# Patient Record
Sex: Female | Born: 1955 | Race: White | Hispanic: No | Smoking: Former smoker
Health system: Southern US, Community
[De-identification: ages and names within clinical notes are randomized; demographics above are authoritative.]

## PROBLEM LIST (undated history)

## (undated) DIAGNOSIS — R609 Edema, unspecified: Secondary | ICD-10-CM

## (undated) DIAGNOSIS — N329 Bladder disorder, unspecified: Secondary | ICD-10-CM

## (undated) DIAGNOSIS — F329 Major depressive disorder, single episode, unspecified: Secondary | ICD-10-CM

## (undated) DIAGNOSIS — M791 Myalgia, unspecified site: Secondary | ICD-10-CM

## (undated) DIAGNOSIS — F32A Depression, unspecified: Secondary | ICD-10-CM

## (undated) DIAGNOSIS — I10 Essential (primary) hypertension: Secondary | ICD-10-CM

## (undated) HISTORY — DX: Major depressive disorder, single episode, unspecified: F32.9

## (undated) HISTORY — DX: Essential (primary) hypertension: I10

## (undated) HISTORY — DX: Bladder disorder, unspecified: N32.9

## (undated) HISTORY — DX: Depression, unspecified: F32.A

## (undated) HISTORY — PX: HAND SURGERY: SHX662

## (undated) HISTORY — DX: Edema, unspecified: R60.9

## (undated) HISTORY — PX: OTHER SURGICAL HISTORY: SHX169

## (undated) HISTORY — PX: FOOT SURGERY: SHX648

## (undated) HISTORY — DX: Myalgia, unspecified site: M79.10

## (undated) HISTORY — PX: ABDOMINAL HYSTERECTOMY: SHX81

---

## 2014-04-28 ENCOUNTER — Ambulatory Visit (INDEPENDENT_AMBULATORY_CARE_PROVIDER_SITE_OTHER): Payer: BC Managed Care – PPO

## 2014-04-28 VITALS — BP 129/78 | HR 55 | Resp 18

## 2014-04-28 DIAGNOSIS — M7732 Calcaneal spur, left foot: Secondary | ICD-10-CM

## 2014-04-28 DIAGNOSIS — M722 Plantar fascial fibromatosis: Secondary | ICD-10-CM

## 2014-04-28 DIAGNOSIS — M79672 Pain in left foot: Secondary | ICD-10-CM

## 2014-04-28 MED ORDER — MELOXICAM 15 MG PO TABS: 15.0000 mg | ORAL_TABLET | Freq: Every day | ORAL | Status: DC

## 2014-04-28 NOTE — Patient Instructions (Signed)
ICE INSTRUCTIONS  Apply ice or cold pack to the affected area at least 3 times a day for 10-15 minutes each time.  You should also use ice after prolonged activity or vigorous exercise.  Do not apply ice longer than 20 minutes at one time.  Always keep a cloth between your skin and the ice pack to prevent [place].  Being consistent and following these instructions will help control your symptoms.  We suggest you purchase a gel ice pack because they are reusable and do bit leak.  Some of them are designed to wrap around the area.  Use the method that works best for you.  Here are some other suggestions for icing.   Use a frozen bag of peas or corn-inexpensive and molds well to your body, usually stays frozen for 10 to 20 minutes.  Wet a towel with cold water and squeeze out the excess until it's damp.  Place in a bag in the freezer for 20 minutes. Then remove and use.   Plantar Fasciitis Plantar fasciitis is a common condition that causes foot pain. It is soreness (inflammation) of the band of tough fibrous tissue on the bottom of the foot that runs from the heel bone (calcaneus) to the ball of the foot. The cause of this soreness may be from excessive standing, poor fitting shoes, running on hard surfaces, being overweight, having an abnormal walk, or overuse (this is common in runners) of the painful foot or feet. It is also common in aerobic exercise dancers and ballet dancers. SYMPTOMS  Most people with plantar fasciitis complain of:  Severe pain in the morning on the bottom of their foot especially when taking the first steps out of bed. This pain recedes after a few minutes of walking.  Severe pain is experienced also during walking following a long period of inactivity.  Pain is worse when walking barefoot or up stairs DIAGNOSIS  3. Your caregiver will diagnose this condition by examining and feeling your foot. 4. Special tests such as X-rays of your foot, are usually not  needed. PREVENTION   Consult a sports medicine professional before beginning a new exercise program.  Walking programs offer a good workout. With walking there is a lower chance of overuse injuries common to runners. There is less impact and less jarring of the joints.  Begin all new exercise programs slowly. If problems or pain develop, decrease the amount of time or distance until you are at a comfortable level.  Wear good shoes and replace them regularly.  Stretch your foot and the heel cords at the back of the ankle (Achilles tendon) both before and after exercise.  Run or exercise on even surfaces that are not hard. For example, asphalt is better than pavement.  Do not run barefoot on hard surfaces.  If using a treadmill, vary the incline.  Do not continue to workout if you have foot or joint problems. Seek professional help if they do not improve. HOME CARE INSTRUCTIONS   Avoid activities that cause you pain until you recover.  Use ice or cold packs on the problem or painful areas after working out.  Only take over-the-counter or prescription medicines for pain, discomfort, or fever as directed by your caregiver.  Soft shoe inserts or athletic shoes with air or gel sole cushions may be helpful.  If problems continue or become more severe, consult a sports medicine caregiver or your own health care provider. Cortisone is a potent anti-inflammatory medication that may be   injected into the painful area. You can discuss this treatment with your caregiver. MAKE SURE YOU:   Understand these instructions.  Will watch your condition.  Will get help right away if you are not doing well or get worse. Document Released: 02/22/2001 Document Revised: 08/22/2011 Document Reviewed: 04/23/2008 Exit[employer] Patient Information 2015 [employer]. This information is not intended to replace advice given to you by your health care provider. Make sure you discuss any questions you have with  your health care provider.  
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
## 2014-05-12 ENCOUNTER — Ambulatory Visit (INDEPENDENT_AMBULATORY_CARE_PROVIDER_SITE_OTHER): Payer: BC Managed Care – PPO

## 2014-05-12 VITALS — BP 128/73 | HR 55 | Resp 18

## 2014-05-12 DIAGNOSIS — M722 Plantar fascial fibromatosis: Secondary | ICD-10-CM

## 2014-05-12 DIAGNOSIS — M79672 Pain in left foot: Secondary | ICD-10-CM

## 2014-05-12 DIAGNOSIS — M7732 Calcaneal spur, left foot: Secondary | ICD-10-CM

## 2014-05-12 NOTE — Progress Notes (Signed)
   Subjective:    Patient ID: Marie Hughes, female    DOB: [birth year], 58 y.o.   MRN: [id number]  HPI I AM DOING BETTER AND THE TAPE DIDN'T HELP MUCH DUE TO SWELLING AND THE MOBIC MADE MY STOMACH AND CHEST HURT    Review of Systems no new findings or systemic changes    Objective:   Physical Exam Neurovascular status is intact pedal pulses are palpable epicritic and proprioceptive sensations intact patient did have an episode of some chest pain and a she had severe heart the [place] cannot take the NSAID and has switched over to plain Tylenol needed patient was in the hospital with some chest pain may been associated with a low potassium however this time otherwise seems to be stable and better as far as her feet go has had some improvement is wearing a good pair of ASICS shoes taping helped temporarily but did not report for long enough period of time.       Assessment & Plan:  Assessment plantar fasciitis/heel spur syndrome left foot did respond somewhat to the fascial strapping immobilization would benefit from functional orthoses orthotics skin carried out for bilateral orthotics at this time patient will be fitted with orthotics within 3-4 weeks are ready for fitting and dispensing in the meantime use ice maintaining good stable shoe and Tylenol as needed for pain  Alvan Dame DPM

## 2014-05-12 NOTE — Patient Instructions (Signed)
ICE INSTRUCTIONS  Apply ice or cold pack to the affected area at least 3 times a day for 10-15 minutes each time.  You should also use ice after prolonged activity or vigorous exercise.  Do not apply ice longer than 20 minutes at one time.  Always keep a cloth between your skin and the ice pack to prevent [place].  Being consistent and following these instructions will help control your symptoms.  We suggest you purchase a gel ice pack because they are reusable and do bit leak.  Some of them are designed to wrap around the area.  Use the method that works best for you.  Here are some other suggestions for icing.   Use a frozen bag of peas or corn-inexpensive and molds well to your body, usually stays frozen for 10 to 20 minutes.  Wet a towel with cold water and squeeze out the excess until it's damp.  Place in a bag in the freezer for 20 minutes. Then remove and use.  Also suggest plain Tylenol if needed for pain

## 2014-06-02 ENCOUNTER — Ambulatory Visit (INDEPENDENT_AMBULATORY_CARE_PROVIDER_SITE_OTHER): Payer: BC Managed Care – PPO

## 2014-06-02 DIAGNOSIS — M722 Plantar fascial fibromatosis: Secondary | ICD-10-CM

## 2014-06-02 DIAGNOSIS — M7732 Calcaneal spur, left foot: Secondary | ICD-10-CM

## 2014-06-02 DIAGNOSIS — M79672 Pain in left foot: Secondary | ICD-10-CM

## 2014-06-02 NOTE — Progress Notes (Signed)
   Subjective:    Patient ID: Marie Hughes, female    DOB: Sep 18, 1955, 58 y.o.   MRN: [id number]  HPI  PUO AND GIVEN INSTRUCTION.  Review of Systems no new findings or systemic changes noted     Objective:   Physical Exam Neurovascular status is intact pulses are palpable. Patient does have findings consistent with plantar fasciitis/heel spur syndrome orthotics are dispensed with on written break in instructions orthotics fit and contour well       Assessment & Plan:  Dispensed orthotics with already instructions. Plantar fasciitis/heel spur syndrome continue with ice orthotic breaking recheck in one to 2 months for adjustments as needed  Alvan Dame DPM

## 2014-06-02 NOTE — Patient Instructions (Signed)

## 2014-07-14 ENCOUNTER — Ambulatory Visit: Payer: BC Managed Care – PPO

## 2015-08-19 ENCOUNTER — Ambulatory Visit (INDEPENDENT_AMBULATORY_CARE_PROVIDER_SITE_OTHER): Payer: Medicare Other | Admitting: Sports Medicine

## 2015-08-19 ENCOUNTER — Encounter: Payer: Self-pay | Admitting: Sports Medicine

## 2015-08-19 ENCOUNTER — Ambulatory Visit (INDEPENDENT_AMBULATORY_CARE_PROVIDER_SITE_OTHER): Payer: Medicare Other

## 2015-08-19 DIAGNOSIS — M79672 Pain in left foot: Secondary | ICD-10-CM

## 2015-08-19 DIAGNOSIS — M2040 Other hammer toe(s) (acquired), unspecified foot: Secondary | ICD-10-CM

## 2015-08-19 DIAGNOSIS — M779 Enthesopathy, unspecified: Secondary | ICD-10-CM | POA: Diagnosis not present

## 2015-08-19 MED ORDER — TRIAMCINOLONE ACETONIDE 10 MG/ML IJ SUSP: 10.0000 mg | Freq: Once | INTRAMUSCULAR | Status: DC

## 2015-08-19 NOTE — Progress Notes (Signed)
Patient ID: Kendrah Lovern, female   DOB: 07-28-1955, 60 y.o.   MRN: [id number] Subjective: Kristan Brummitt is a 60 y.o. female patient who presents to office for evaluation of left foot pain. Patient complains of progressive pain especially over the last few months the Left foot at the base of her second toe hurts with walking feels a sharp pain inside the joint. Patient has tried change in shoes with no relief in symptoms. Patient denies any other pedal complaints. Denies injury/trip/fall/sprain/any causative factors.   Admits that she cannot tolerate mobic and cannot take prednisone longer than 1 week orally.   There are no active problems to display for this patient.   Current Outpatient Prescriptions on File Prior to Visit  Medication Sig Dispense Refill  . amLODipine (NORVASC) 10 MG tablet Take 10 mg by mouth daily.    Marland Kitchen atorvastatin (LIPITOR) 20 MG tablet   4  . carisoprodol (SOMA) 350 MG tablet   0  . carvedilol (COREG) 12.5 MG tablet Take 12.5 mg by mouth 2 (two) times daily with a meal.    . diazepam (VALIUM) 5 MG tablet Take 5 mg by mouth every 6 (six) hours as needed for anxiety.    Marland Kitchen FLUoxetine (PROZAC) 20 MG capsule   0  . HYDROcodone-acetaminophen (NORCO) 10-325 MG per tablet   0  . KLOR-CON M20 20 MEQ tablet   0  . lisinopril-hydrochlorothiazide (PRINZIDE,ZESTORETIC) 20-12.5 MG per tablet Take 1 tablet by mouth daily.    . meloxicam (MOBIC) 15 MG tablet Take 1 tablet (15 mg total) by mouth daily. 30 tablet 1  . triamcinolone cream (KENALOG) 0.1 %   0   No current facility-administered medications on file prior to visit.    No Known Allergies  Objective:  General: Alert and oriented x3 in no acute distress  Dermatology: No open lesions bilateral lower extremities, no webspace macerations, no ecchymosis bilateral, all nails x 10 are well manicured.  Vascular: Dorsalis Pedis and Posterior Tibial pedal pulses palpable, Capillary Fill Time 3 seconds,(+) pedal hair growth  bilateral, no edema bilateral lower extremities, Temperature gradient within normal limits.  Neurology: Michaell Cowing sensation intact via light touch bilateral. (- )Moulders and Tinels sign bilateral.   Musculoskeletal: Mild tenderness with palpation at 2nd MTPJ on left foot, with early hammertoe deformity. No pain with calf compression bilateral. .Strength within normal limits in all groups bilateral.   Gait: Antalgic gait  Xrays  Left Foot   Impression: Normal osseous mineralization. There is significant inferior calcaneal heel spur. There is narrowing at the second metatarsophalangeal joint with increased thickening along the second metatarsal shaft consistent with likely old fracture or injury or stress overload. There are no obvious acute fractures or dislocations, no foreign body,soft tissues are within normal limits  Assessment and Plan: Problem List Items Addressed This Visit    None    Visit Diagnoses    Left foot pain    -  Primary    Relevant Medications    triamcinolone acetonide (KENALOG) 10 MG/ML injection 10 mg    Other Relevant Orders    DG Foot 2 Views Left    Capsulitis        2nd MTPJ    Relevant Medications    triamcinolone acetonide (KENALOG) 10 MG/ML injection 10 mg    Hammer toe, unspecified laterality        early       -Complete examination performed -Xrays reviewed -Discussed treatement options 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 Post-injection care discussed with patient.  -Gave patient offloading metatarsal pad and instructed on use -Recommend icing daily -No oral anti-inflammatories given at this time due to patient intolerance -Patient to return to office in 3 weeks or sooner if condition  worsens.  Asencion Islam, DPM

## 2015-09-09 ENCOUNTER — Ambulatory Visit: Payer: Medicare Other | Admitting: Sports Medicine

## 2015-11-26 ENCOUNTER — Ambulatory Visit: Payer: Medicare Other | Admitting: Sports Medicine

## 2015-12-02 ENCOUNTER — Ambulatory Visit (INDEPENDENT_AMBULATORY_CARE_PROVIDER_SITE_OTHER): Payer: Medicare Other | Admitting: Sports Medicine

## 2015-12-02 ENCOUNTER — Encounter: Payer: Self-pay | Admitting: Sports Medicine

## 2015-12-02 ENCOUNTER — Telehealth: Payer: Self-pay | Admitting: *Deleted

## 2015-12-02 DIAGNOSIS — M069 Rheumatoid arthritis, unspecified: Secondary | ICD-10-CM

## 2015-12-02 DIAGNOSIS — M779 Enthesopathy, unspecified: Secondary | ICD-10-CM

## 2015-12-02 DIAGNOSIS — M79672 Pain in left foot: Secondary | ICD-10-CM | POA: Diagnosis not present

## 2015-12-02 DIAGNOSIS — M2040 Other hammer toe(s) (acquired), unspecified foot: Secondary | ICD-10-CM

## 2015-12-02 MED ORDER — TRIAMCINOLONE ACETONIDE 10 MG/ML IJ SUSP: 10.0000 mg | Freq: Once | INTRAMUSCULAR | Status: DC

## 2015-12-02 NOTE — Progress Notes (Signed)
Patient ID: Marie Hughes, female   DOB: September 08, 1955, 60 y.o.   MRN: [id number] Subjective: Marie Hughes is a 60 y.o. female patient who returns to office for evaluation of left foot pain at second toe joint. States that the injection helped tremendously for about a month, then slowly. Her pain started to come back that consists of throbbing and aching at the second toe joint with increased periods of walking and standing. States that the pads seem to help a little. Patient denies any other pedal complaints.   There are no active problems to display for this patient.   Current Outpatient Prescriptions on File Prior to Visit  Medication Sig Dispense Refill  . amitriptyline (ELAVIL) 10 MG tablet     . amLODipine (NORVASC) 10 MG tablet Take 10 mg by mouth daily.    Marland Kitchen atorvastatin (LIPITOR) 20 MG tablet   4  . atorvastatin (LIPITOR) 40 MG tablet     . carisoprodol (SOMA) 350 MG tablet   0  . carvedilol (COREG) 12.5 MG tablet Take 12.5 mg by mouth 2 (two) times daily with a meal.    . diazepam (VALIUM) 5 MG tablet Take 5 mg by mouth every 6 (six) hours as needed for anxiety.    Marland Kitchen FLUoxetine (PROZAC) 20 MG capsule   0  . HYDROcodone-acetaminophen (NORCO) 10-325 MG per tablet   0  . KLOR-CON M20 20 MEQ tablet   0  . lisinopril-hydrochlorothiazide (PRINZIDE,ZESTORETIC) 20-12.5 MG per tablet Take 1 tablet by mouth daily.    . meloxicam (MOBIC) 15 MG tablet Take 1 tablet (15 mg total) by mouth daily. 30 tablet 1  . nitrofurantoin (MACRODANTIN) 100 MG capsule     . triamcinolone cream (KENALOG) 0.1 %   0   Current Facility-Administered Medications on File Prior to Visit  Medication Dose Route Frequency Provider Last Rate Last Dose  . triamcinolone acetonide (KENALOG) 10 MG/ML injection 10 mg  10 mg Other Once [employer], DPM        No Known Allergies  Objective:  General: Alert and oriented x3 in no acute distress  Dermatology: No open lesions bilateral lower extremities, no webspace  macerations, no ecchymosis bilateral, all nails x 10 are well manicured.  Vascular: Dorsalis Pedis and Posterior Tibial pedal pulses palpable, Capillary Fill Time 3 seconds,(+) pedal hair growth bilateral, no edema bilateral lower extremities, Temperature gradient within normal limits.  Neurology: Michaell Cowing sensation intact via light touch bilateral. (- )Moulders and Tinels sign bilateral.   Musculoskeletal: Mild tenderness with palpation at 2nd MTPJ on left foot, with early hammertoe deformity. No pain with calf compression bilateral. .Strength within normal limits in all groups bilateral.    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 This is injection #2. Post-injection care discussed with patient.  -Gave patient offloading  metatarsal pad and instructed on use -Recommend icing daily -No oral steroids or anti-inflammatories given at this time due to patient intolerance -Recommended MRI for further evaluation of second toe joint and to also rule out any plantar plate involvement. Patient reports that she would like the office to check her insurance prior to ordering thus, I informed patient that I will have office nurse to contact her with the protocol for MRI if patient is agreeable, then we will proceed with ordering a MRI -Patient to return to office As needed or sooner if condition  worsens.  Asencion Islam, DPM

## 2015-12-02 NOTE — Telephone Encounter (Addendum)
-----   Message from Asencion Islam, [place] sent at 12/02/2015 10:09 AM EDT ----- Regarding: MRI Coverage  Patient wants to know if her insurance will cover the cost of a MRI if she were to have it done for her left foot. I talked with her today about possible need for MRI for her left foot 2nd MTPJ pain to eval for arthritis and plantar plate. Thanks Dr. Marylene Land. Left message informing pt her insurance did not require pre-cert, and she could call [facility] [phone], for her estimate. Faxed orders to [facility].

## 2016-01-05 DIAGNOSIS — E6609 Other obesity due to excess calories: Secondary | ICD-10-CM | POA: Insufficient documentation

## 2016-01-05 DIAGNOSIS — E782 Mixed hyperlipidemia: Secondary | ICD-10-CM

## 2016-01-05 DIAGNOSIS — F1120 Opioid dependence, uncomplicated: Secondary | ICD-10-CM

## 2016-01-05 DIAGNOSIS — M4802 Spinal stenosis, cervical region: Secondary | ICD-10-CM

## 2016-01-05 DIAGNOSIS — M5416 Radiculopathy, lumbar region: Secondary | ICD-10-CM

## 2016-01-05 DIAGNOSIS — I10 Essential (primary) hypertension: Secondary | ICD-10-CM

## 2016-01-05 DIAGNOSIS — I160 Hypertensive urgency: Secondary | ICD-10-CM

## 2016-01-05 DIAGNOSIS — F3341 Major depressive disorder, recurrent, in partial remission: Secondary | ICD-10-CM

## 2016-01-05 HISTORY — DX: Spinal stenosis, cervical region: M48.02

## 2016-01-05 HISTORY — DX: Opioid dependence, uncomplicated: F11.20

## 2016-01-05 HISTORY — DX: Major depressive disorder, recurrent, in partial remission: F33.41

## 2016-01-05 HISTORY — DX: Mixed hyperlipidemia: E78.2

## 2016-01-05 HISTORY — DX: Essential (primary) hypertension: I10

## 2016-01-05 HISTORY — DX: Other obesity due to excess calories: E66.09

## 2016-01-05 HISTORY — DX: Hypertensive urgency: I16.0

## 2016-01-05 HISTORY — DX: Radiculopathy, lumbar region: M54.16

## 2016-02-04 ENCOUNTER — Telehealth: Payer: Self-pay | Admitting: *Deleted

## 2016-02-04 NOTE — Telephone Encounter (Addendum)
Pt states she had an MRI this week and they told her Dr. Marylene Land would have the results in a few days and no one has called. I told pt that [facility] is not on the same electronic system as [facility] and that often they don't fax the results but I would get the results faxed to [place], and that Dr. Marylene Land would want her to make an appt to discuss.  Transferred pt to schedulers. Faxed copy of MRI results from [facility]. 03/02/2016-Pt states the antiinflammatory medication is bothering her stomach, she would like to schedule surgery with Dr. Marylene Land. 03/04/2016-Pt states she would like to schedule surgery.

## 2016-02-11 ENCOUNTER — Encounter (INDEPENDENT_AMBULATORY_CARE_PROVIDER_SITE_OTHER): Payer: Self-pay

## 2016-02-11 ENCOUNTER — Ambulatory Visit (INDEPENDENT_AMBULATORY_CARE_PROVIDER_SITE_OTHER): Payer: Medicare Other | Admitting: Sports Medicine

## 2016-02-11 ENCOUNTER — Encounter: Payer: Self-pay | Admitting: Sports Medicine

## 2016-02-11 DIAGNOSIS — G5762 Lesion of plantar nerve, left lower limb: Secondary | ICD-10-CM

## 2016-02-11 DIAGNOSIS — G5782 Other specified mononeuropathies of left lower limb: Secondary | ICD-10-CM

## 2016-02-11 DIAGNOSIS — M779 Enthesopathy, unspecified: Secondary | ICD-10-CM

## 2016-02-11 DIAGNOSIS — M2040 Other hammer toe(s) (acquired), unspecified foot: Secondary | ICD-10-CM

## 2016-02-11 DIAGNOSIS — M79672 Pain in left foot: Secondary | ICD-10-CM

## 2016-02-11 MED ORDER — ALCOHOL 98 % IV SOLN: 0.5000 mL | Freq: Once | INTRAVENOUS | Status: DC

## 2016-02-11 NOTE — Patient Instructions (Signed)
Morton Neuralgia  Morton neuralgia is a type of foot pain in the area closest to your toes. This area is sometimes called the ball of your foot. Morton neuralgia occurs when a branch of a nerve in your foot (digital nerve) becomes compressed.   When this happens over a long period of time, the nerve can thicken (neuroma) and cause pain. This usually occurs between the third and fourth toe. Morton neuralgia can come and go but may get worse over time.   CAUSES  Your digital nerve can become compressed and stretched at a point where it passes under a thick band of tissue that connects your toes (intermetatarsal ligament). Morton neuralgia can be caused by mild repetitive damage in this area. This type of damage can result from:   · Activities such as running or jumping.  · Wearing shoes that are too tight.  RISK FACTORS  You may be at risk for Morton neuralgia if you:  · Are female.  · Wear high heels.  · Wear shoes that are narrow or tight.  · Participate in activities that stretch your toes. These include:  ¨ Running.  ¨ Ballet.  ¨ Long-distance walking.  SIGNS AND SYMPTOMS  The first symptom of Morton neuralgia is pain that spreads from the ball of your foot to your toes. It may feel like you are walking on a marble. Pain usually gets worse with walking and goes away at night. Other symptoms may include numbness and cramping of your toes.  DIAGNOSIS   Your health care provider will do a physical exam. When doing the exam, your health care provider may:   · Squeeze your foot just behind your toe.  · Ask you to move your toes to check for pain.  You may also have tests on your foot to confirm the diagnosis. These may include:   · An X-ray.  · An MRI.  TREATMENT   Treatment for Morton neuralgia may be as simple as changing the kind of shoes you wear. Other treatments may include:  · Wearing a supportive pad (orthosis) under the front of your foot. This lifts your toe bones and takes pressure off the nerve.  · Getting  injections of numbing medicine and anti-inflammatory medicine (steroid) in the nerve.  · Having surgery to remove part of the thickened nerve.  HOME CARE INSTRUCTIONS   · Take medicine only as directed by your health care provider.  · Wear soft-soled shoes with a wide toe area.  · Stop activities that may be causing pain.  · Elevate your foot when resting.  · Massage your foot.  · Apply ice to the injured area:      Put ice in a plastic bag.    Place a towel between your skin and the bag.    Leave the ice on for 20 minutes, 2-3 times a day.    · Keep all follow-up visits as directed by your health care provider. This is important.  SEEK MEDICAL CARE IF:  · Home care instructions are not helping you get better.  · Your symptoms change or get worse.     This information is not intended to replace advice given to you by your health care provider. Make sure you discuss any questions you have with your health care provider.     Document Released: 09/05/2000 Document Revised: 06/20/2014 Document Reviewed: 07/31/2013  [employer]vier Interactive Patient Education ©2016 [employer]

## 2016-02-11 NOTE — Progress Notes (Signed)
Patient ID: Ruchy Wildrick, female   DOB: 09/19/1955, 60 y.o.   MRN: [id number] Subjective: Darsha Zumstein is a 60 y.o. female patient who returns to office for evaluation of left foot pain at ball of the second toe and in between toes. States that she had her MRI done and is here for review results. Reports that pad seems to now aggravate where it use to help her toes.  Patient denies any other pedal complaints.   There are no active problems to display for this patient.   Current Outpatient Prescriptions on File Prior to Visit  Medication Sig Dispense Refill  . amitriptyline (ELAVIL) 10 MG tablet     . amLODipine (NORVASC) 10 MG tablet Take 10 mg by mouth daily.    Marland Kitchen atorvastatin (LIPITOR) 20 MG tablet   4  . atorvastatin (LIPITOR) 40 MG tablet     . carisoprodol (SOMA) 350 MG tablet   0  . carvedilol (COREG) 12.5 MG tablet Take 12.5 mg by mouth 2 (two) times daily with a meal.    . diazepam (VALIUM) 5 MG tablet Take 5 mg by mouth every 6 (six) hours as needed for anxiety.    Marland Kitchen FLUoxetine (PROZAC) 20 MG capsule   0  . HYDROcodone-acetaminophen (NORCO) 10-325 MG per tablet   0  . KLOR-CON M20 20 MEQ tablet   0  . lisinopril-hydrochlorothiazide (PRINZIDE,ZESTORETIC) 20-12.5 MG per tablet Take 1 tablet by mouth daily.    . meloxicam (MOBIC) 15 MG tablet Take 1 tablet (15 mg total) by mouth daily. 30 tablet 1  . nitrofurantoin (MACRODANTIN) 100 MG capsule     . triamcinolone cream (KENALOG) 0.1 %   0   Current Facility-Administered Medications on File Prior to Visit  Medication Dose Route Frequency Provider Last Rate Last Dose  . triamcinolone acetonide (KENALOG) 10 MG/ML injection 10 mg  10 mg Other Once [employer], DPM      . triamcinolone acetonide (KENALOG) 10 MG/ML injection 10 mg  10 mg Other Once [employer], DPM        No Known Allergies  Objective:  General: Alert and oriented x3 in no acute distress  Dermatology: No open lesions bilateral lower extremities, no webspace  macerations, no ecchymosis bilateral, all nails x 10 are well manicured.  Vascular: Dorsalis Pedis and Posterior Tibial pedal pulses palpable, Capillary Fill Time 3 seconds,(+) pedal hair growth bilateral, no edema bilateral lower extremities, Temperature gradient within normal limits.  Neurology: Gross sensation intact via light touch bilateral. (- )Moulders and Tinels sign bilateral. Subjective numbness to second and third toes, likely consistent with neuroma.  Musculoskeletal: Mild tenderness with palpation at 2nd MTPJ on left foot, with early hammertoe deformity. No pain with calf compression bilateral. .Strength within normal limits in all groups bilateral.   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 Patient was noted to have some pain during the injection thus only half the injection was utilized This is injection # 1. Post-injection care discussed with patient.  -Recommend icing daily -No oral steroids or anti-inflammatories given at this time due to patient GI intolerance -Recommended good supportive shoes daily -Patient to return to office in 2 weeks for follow-up evaluation and consideration of another sclerosing shot versus surgical consult or sooner if condition worsens.  Asencion Islam, DPM

## 2016-02-16 ENCOUNTER — Encounter: Payer: Self-pay | Admitting: Sports Medicine

## 2016-02-24 ENCOUNTER — Encounter: Payer: Self-pay | Admitting: Sports Medicine

## 2016-02-24 ENCOUNTER — Ambulatory Visit (INDEPENDENT_AMBULATORY_CARE_PROVIDER_SITE_OTHER): Payer: Medicare Other | Admitting: Sports Medicine

## 2016-02-24 VITALS — Ht 61.0 in | Wt 205.0 lb

## 2016-02-24 DIAGNOSIS — M069 Rheumatoid arthritis, unspecified: Secondary | ICD-10-CM

## 2016-02-24 DIAGNOSIS — G5762 Lesion of plantar nerve, left lower limb: Secondary | ICD-10-CM

## 2016-02-24 DIAGNOSIS — M2040 Other hammer toe(s) (acquired), unspecified foot: Secondary | ICD-10-CM

## 2016-02-24 DIAGNOSIS — M779 Enthesopathy, unspecified: Secondary | ICD-10-CM | POA: Diagnosis not present

## 2016-02-24 DIAGNOSIS — M79672 Pain in left foot: Secondary | ICD-10-CM | POA: Diagnosis not present

## 2016-02-24 DIAGNOSIS — G5782 Other specified mononeuropathies of left lower limb: Secondary | ICD-10-CM

## 2016-02-24 MED ORDER — METHYLPREDNISOLONE 4 MG PO TBPK: ORAL_TABLET | ORAL | 0 refills | Status: DC

## 2016-02-24 NOTE — Progress Notes (Signed)
Patient ID: Marie Hughes, female   DOB: [birth year], 60 y.o.   MRN: [id number] Subjective: Marie Hughes is a 60 y.o. female patient who returns to office for evaluation of left foot pain at ball of the second toe and in between toes; states injection last visit hurt and made pain feel worse. Does not want another shot.  Patient denies any other pedal complaints.   There are no active problems to display for this patient.   Current Outpatient Prescriptions on File Prior to Visit  Medication Sig Dispense Refill  . amitriptyline (ELAVIL) 10 MG tablet     . amLODipine (NORVASC) 10 MG tablet Take 10 mg by mouth daily.    Marland Kitchen atorvastatin (LIPITOR) 20 MG tablet   4  . atorvastatin (LIPITOR) 40 MG tablet     . carisoprodol (SOMA) 350 MG tablet   0  . carvedilol (COREG) 12.5 MG tablet Take 12.5 mg by mouth 2 (two) times daily with a meal.    . diazepam (VALIUM) 5 MG tablet Take 5 mg by mouth every 6 (six) hours as needed for anxiety.    Marland Kitchen FLUoxetine (PROZAC) 20 MG capsule   0  . HYDROcodone-acetaminophen (NORCO) 10-325 MG per tablet   0  . KLOR-CON M20 20 MEQ tablet   0  . lisinopril-hydrochlorothiazide (PRINZIDE,ZESTORETIC) 20-12.5 MG per tablet Take 1 tablet by mouth daily.    . meloxicam (MOBIC) 15 MG tablet Take 1 tablet (15 mg total) by mouth daily. 30 tablet 1  . nitrofurantoin (MACRODANTIN) 100 MG capsule     . triamcinolone cream (KENALOG) 0.1 %   0   Current Facility-Administered Medications on File Prior to Visit  Medication Dose Route Frequency Provider Last Rate Last Dose  . Alcohol (DEHYDRATED ALCOHOL 98%) 98 % injection 0.5 mL  0.5 mL Intravenous Once Mayleigh Tetrault, DPM      . triamcinolone acetonide (KENALOG) 10 MG/ML injection 10 mg  10 mg Other Once [employer], DPM      . triamcinolone acetonide (KENALOG) 10 MG/ML injection 10 mg  10 mg Other Once [employer], DPM        No Known Allergies  Objective:  General: Alert and oriented x3 in no acute  distress  Dermatology: No open lesions bilateral lower extremities, no webspace macerations, no ecchymosis bilateral, all nails x 10 are well manicured.  Vascular: Dorsalis Pedis and Posterior Tibial pedal pulses palpable, Capillary Fill Time 3 seconds,(+) pedal hair growth bilateral, no edema bilateral lower extremities, Temperature gradient within normal limits.  Neurology: Gross sensation intact via light touch bilateral. (- )Moulders and Tinels sign bilateral. Subjective numbness to second and third toes, likely consistent with neuroma.  Musculoskeletal: Mild tenderness with palpation at 2nd MTPJ on left foot, with early hammertoe deformity. No pain with calf compression bilateral. .Strength within normal limits in all groups bilateral.   MRI suggestive of neuroma between second and third metatarsal heads   Assessment and Plan: Problem List Items Addressed This Visit    None    Visit Diagnoses    Neuroma digital nerve, left    -  Primary   Relevant Medications   methylPREDNISolone (MEDROL DOSEPAK) 4 MG TBPK tablet   Left foot pain       Relevant Medications   methylPREDNISolone (MEDROL DOSEPAK) 4 MG TBPK tablet   Capsulitis       Relevant Medications   methylPREDNISolone (MEDROL DOSEPAK) 4 MG TBPK tablet   Hammer toe, unspecified laterality  Rheumatoid arthritis involving left foot, unspecified rheumatoid factor presence (HCC)       Relevant Medications   methylPREDNISolone (MEDROL DOSEPAK) 4 MG TBPK tablet      -Complete examination performed -Discussed treatement options 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 -Patient states that she may want to have surgery next month and will discuss this also with her Part-Time Job, I recommend at minimum 4 weeks no work post-op.   Asencion Islam, DPM

## 2016-02-26 ENCOUNTER — Encounter: Payer: Self-pay | Admitting: Sports Medicine

## 2016-03-08 ENCOUNTER — Telehealth: Payer: Self-pay | Admitting: *Deleted

## 2016-03-08 NOTE — Telephone Encounter (Signed)
"  Marie Hughes 123/31/1957.  Thank you."

## 2016-03-09 ENCOUNTER — Ambulatory Visit (INDEPENDENT_AMBULATORY_CARE_PROVIDER_SITE_OTHER): Payer: Medicare Other | Admitting: Sports Medicine

## 2016-03-09 ENCOUNTER — Encounter: Payer: Self-pay | Admitting: Sports Medicine

## 2016-03-09 DIAGNOSIS — G5782 Other specified mononeuropathies of left lower limb: Secondary | ICD-10-CM

## 2016-03-09 DIAGNOSIS — M779 Enthesopathy, unspecified: Secondary | ICD-10-CM | POA: Diagnosis not present

## 2016-03-09 DIAGNOSIS — G5762 Lesion of plantar nerve, left lower limb: Secondary | ICD-10-CM | POA: Diagnosis not present

## 2016-03-09 DIAGNOSIS — M79672 Pain in left foot: Secondary | ICD-10-CM

## 2016-03-09 NOTE — Progress Notes (Signed)
Patient ID: Shaunette Gassner, female   DOB: 1955/07/21, 60 y.o.   MRN: [id number] Subjective: Eeva Schlosser is a 60 y.o. female patient who returns to office for evaluation of left foot pain at ball of the second toe and in between toes; states that steroids helped a little with pain and swelling for 1 weeks but now symptoms are back. Desires to discuss surgery. Does not want another shot or other conservative treatments.  Patient denies any other pedal complaints.   There are no active problems to display for this patient.   Current Outpatient Prescriptions on File Prior to Visit  Medication Sig Dispense Refill  . amitriptyline (ELAVIL) 10 MG tablet     . amLODipine (NORVASC) 10 MG tablet Take 10 mg by mouth daily.    Marland Kitchen atorvastatin (LIPITOR) 20 MG tablet   4  . atorvastatin (LIPITOR) 40 MG tablet     . carisoprodol (SOMA) 350 MG tablet   0  . carvedilol (COREG) 12.5 MG tablet Take 12.5 mg by mouth 2 (two) times daily with a meal.    . diazepam (VALIUM) 5 MG tablet Take 5 mg by mouth every 6 (six) hours as needed for anxiety.    Marland Kitchen FLUoxetine (PROZAC) 20 MG capsule   0  . HYDROcodone-acetaminophen (NORCO) 10-325 MG per tablet   0  . KLOR-CON M20 20 MEQ tablet   0  . lisinopril-hydrochlorothiazide (PRINZIDE,ZESTORETIC) 20-12.5 MG per tablet Take 1 tablet by mouth daily.    . meloxicam (MOBIC) 15 MG tablet Take 1 tablet (15 mg total) by mouth daily. 30 tablet 1  . methylPREDNISolone (MEDROL DOSEPAK) 4 MG TBPK tablet Take as instructed 21 tablet 0  . nitrofurantoin (MACRODANTIN) 100 MG capsule     . triamcinolone cream (KENALOG) 0.1 %   0   Current Facility-Administered Medications on File Prior to Visit  Medication Dose Route Frequency Provider Last Rate Last Dose  . Alcohol (DEHYDRATED ALCOHOL 98%) 98 % injection 0.5 mL  0.5 mL Intravenous Once Reiana Poteet, DPM      . triamcinolone acetonide (KENALOG) 10 MG/ML injection 10 mg  10 mg Other Once [employer], DPM      . triamcinolone  acetonide (KENALOG) 10 MG/ML injection 10 mg  10 mg Other Once [employer], DPM        No Known Allergies  Objective:  General: Alert and oriented x3 in no acute distress  Dermatology: No open lesions bilateral lower extremities, no webspace macerations, no ecchymosis bilateral, all nails x 10 are well manicured.  Vascular: Dorsalis Pedis and Posterior Tibial pedal pulses palpable, Capillary Fill Time 3 seconds,(+) pedal hair growth bilateral, no edema bilateral lower extremities, Temperature gradient within normal limits.  Neurology: Gross sensation intact via light touch bilateral. (- ) Moulders and Tinels sign bilateral. Subjective numbness to second and third toes, likely consistent with neuroma as confirmed on MRI.  Musculoskeletal: Mild tenderness with palpation at 2nd MTPJ on left foot, with early hammertoe deformity. No pain with calf compression bilateral. .Strength within normal limits in all groups bilateral.   MRI suggestive of neuroma between second and third metatarsal heads   Assessment and Plan: Problem List Items Addressed This Visit    None    Visit Diagnoses    Neuroma digital nerve, left    -  Primary   Capsulitis       Left foot pain          -Complete examination performed -Discussed treatement options Neuroma with reoccurring  capsulitis secondary to early arthritis/ Hammertoe deformity of the left second -Patient declined repeat injection  -Patient opt for surgical management. Consent obtained for excision of neuroma and capsulotomy left 2nd MTPJ. Pre and Post op course explained. Risks, benefits, alternatives explained. No guarantees given or implied. Surgical booking slip submitted and provided patient with Surgical packet and info for [facility] surgical center. -Dispensed surgical shoe to use post op -Gave compression sock to assist with edema control and to give support to left foot  -Recommend icing daily -Recommended good supportive shoes  daily -Patient to return to office after surgery.  -Patient is aware will need time off during post op period for recovery from her Part-Time Job, I recommend at minimum 2-4 weeks; no work post-op.   Asencion Islam, DPM

## 2016-03-09 NOTE — Patient Instructions (Signed)
Pre-Operative Instructions  Congratulations, you have decided to take an important step to improving your quality of life.  You can be assured that the doctors of [facility] will be with you every step of the way.  1. Plan to be at the surgery center/hospital at least 1 (one) hour prior to your scheduled time unless otherwise directed by the surgical center/hospital staff.  You must have a responsible adult accompany you, remain during the surgery and drive you home.  Make sure you have directions to the surgical center/hospital and know how to get there on time. 2. For hospital based surgery you will need to obtain a history and physical form from your family physician within 1 month prior to the date of surgery- we will give you a form for you primary physician.  3. We make every effort to accommodate the date you request for surgery.  There are however, times where surgery dates or times have to be moved.  We will contact you as soon as possible if a change in schedule is required.   4. No Aspirin/Ibuprofen for one week before surgery.  If you are on aspirin, any non-steroidal anti-inflammatory medications (Mobic, Aleve, Ibuprofen) you should stop taking it 7 days prior to your surgery.  You make take Tylenol  For pain prior to surgery.  5. Medications- If you are taking daily heart and blood pressure medications, seizure, reflux, allergy, asthma, anxiety, pain or diabetes medications, make sure the surgery center/hospital is aware before the day of surgery so they may notify you which medications to take or avoid the day of surgery. 6. No food or drink after midnight the night before surgery unless directed otherwise by surgical center/hospital staff. 7. No alcoholic beverages 24 hours prior to surgery.  No smoking 24 hours prior to or 24 hours after surgery. 8. Wear loose pants or shorts- loose enough to fit over bandages, boots, and casts. 9. No slip on shoes, sneakers are best. 10. Bring  your boot with you to the surgery center/hospital.  Also bring crutches or a walker if your physician has prescribed it for you.  If you do not have this equipment, it will be provided for you after surgery. 11. If you have not been contracted by the surgery center/hospital by the day before your surgery, call to confirm the date and time of your surgery. 12. Leave-time from work may vary depending on the type of surgery you have.  Appropriate arrangements should be made prior to surgery with your employer. 13. Prescriptions will be provided immediately following surgery by your doctor.  Have these filled as soon as possible after surgery and take the medication as directed. 14. Remove nail polish on the operative foot. 15. Wash the night before surgery.  The night before surgery wash the foot and leg well with the antibacterial soap provided and water paying special attention to beneath the toenails and in between the toes.  Rinse thoroughly with water and dry well with a towel.  Perform this wash unless told not to do so by your physician.  Enclosed: 1 Ice pack (please put in freezer the night before surgery)   1 Hibiclens skin cleaner   Pre-op Instructions  If you have any questions regarding the instructions, do not hesitate to call our office.  [place]: [address] [place], [place] [address] [phone]  [place]: [address] [place], [place] [address] [phone]  [place]: [address]  [place], [place] [address] [phone]   Dr.   Norman Regal DPM, Dr. Matthew Wagoner DPM, Dr. M. Todd Hyatt DPM, Dr. Yasser Hepp DPM 

## 2016-03-10 NOTE — Telephone Encounter (Signed)
"  I need to schedule surgery with Dr. Marylene Land in [place]."  Do you have a date in mind?  "I would like to do it on October 9th or 10th but preferably the 10th."  Dr. Marylene Land can do it on October 16.

## 2016-03-16 ENCOUNTER — Encounter: Payer: Self-pay | Admitting: *Deleted

## 2016-03-28 ENCOUNTER — Encounter: Payer: Self-pay | Admitting: Sports Medicine

## 2016-03-28 DIAGNOSIS — M7752 Other enthesopathy of left foot: Secondary | ICD-10-CM | POA: Diagnosis not present

## 2016-03-28 DIAGNOSIS — G5762 Lesion of plantar nerve, left lower limb: Secondary | ICD-10-CM | POA: Diagnosis not present

## 2016-03-29 ENCOUNTER — Telehealth: Payer: Self-pay | Admitting: Sports Medicine

## 2016-03-29 NOTE — Telephone Encounter (Signed)
Post op phone call made to patient. Patient reports soreness to surgical foot and episode of pain last night that eased off after taking pain medication. Patient denies any other symptoms. Patient to follow up as scheduled on next week for post op care or sooner if issues arise. -Dr. Marylene Land

## 2016-03-30 NOTE — Progress Notes (Signed)
DOS 10.16.2017 Excision of Neuroma Left Foot 2nd Toe Joint Release of Capsule Left Foot

## 2016-04-06 ENCOUNTER — Ambulatory Visit (INDEPENDENT_AMBULATORY_CARE_PROVIDER_SITE_OTHER): Payer: Medicare Other | Admitting: Sports Medicine

## 2016-04-06 ENCOUNTER — Ambulatory Visit (INDEPENDENT_AMBULATORY_CARE_PROVIDER_SITE_OTHER): Payer: Medicare Other

## 2016-04-06 ENCOUNTER — Encounter: Payer: Self-pay | Admitting: Sports Medicine

## 2016-04-06 VITALS — BP 137/74 | HR 54 | Temp 97.8°F

## 2016-04-06 DIAGNOSIS — M79672 Pain in left foot: Secondary | ICD-10-CM

## 2016-04-06 DIAGNOSIS — M779 Enthesopathy, unspecified: Secondary | ICD-10-CM

## 2016-04-06 DIAGNOSIS — Z9889 Other specified postprocedural states: Secondary | ICD-10-CM

## 2016-04-06 DIAGNOSIS — G5782 Other specified mononeuropathies of left lower limb: Secondary | ICD-10-CM

## 2016-04-06 DIAGNOSIS — G5762 Lesion of plantar nerve, left lower limb: Secondary | ICD-10-CM | POA: Diagnosis not present

## 2016-04-06 NOTE — Progress Notes (Signed)
Subjective: Marie Hughes is a 60 y.o. female patient seen today in office for POV #1 (DOS 03-28-16), S/P Left 2nd interspace neurectomy and 2nd MTPJ release with removal of lateral spur at met head. Patient current denies pain at surgical site, admits that she does have pain with walking or standing but its much better than before surgery; denies calf pain, denies headache, chest pain, shortness of breath, nausea, vomiting, fever, or chills.  No other issues noted.   Patient Active Problem List   Diagnosis Date Noted  . Degenerative cervical spinal stenosis 01/05/2016  . Essential hypertension 01/05/2016  . Lumbar radiculitis 01/05/2016  . Mixed hyperlipidemia 01/05/2016  . Obesity due to excess calories 01/05/2016  . Recurrent major depressive disorder, in partial remission ([place]) 01/05/2016  . Uncomplicated opioid dependence ([place]) 01/05/2016    Current Outpatient Prescriptions on File Prior to Visit  Medication Sig Dispense Refill  . amitriptyline (ELAVIL) 10 MG tablet     . amLODipine (NORVASC) 10 MG tablet Take 10 mg by mouth daily.    Marland Kitchen atorvastatin (LIPITOR) 20 MG tablet   4  . atorvastatin (LIPITOR) 40 MG tablet     . carisoprodol (SOMA) 350 MG tablet   0  . carvedilol (COREG) 12.5 MG tablet Take 12.5 mg by mouth 2 (two) times daily with a meal.    . diazepam (VALIUM) 5 MG tablet Take 5 mg by mouth every 6 (six) hours as needed for anxiety.    . docusate sodium (COLACE) 100 MG capsule Take 100 mg by mouth daily as needed for mild constipation.    Marland Kitchen FLUoxetine (PROZAC) 20 MG capsule   0  . HYDROcodone-acetaminophen (NORCO) 10-325 MG per tablet   0  . KLOR-CON M20 20 MEQ tablet   0  . lisinopril-hydrochlorothiazide (PRINZIDE,ZESTORETIC) 20-12.5 MG per tablet Take 1 tablet by mouth daily.    . meloxicam (MOBIC) 15 MG tablet Take 1 tablet (15 mg total) by mouth daily. 30 tablet 1  . methylPREDNISolone (MEDROL DOSEPAK) 4 MG TBPK tablet Take as instructed 21 tablet 0  . nitrofurantoin  (MACRODANTIN) 100 MG capsule     . oxyCODONE-acetaminophen (PERCOCET) 10-325 MG tablet Take 1 tablet by mouth every 6 (six) hours as needed for pain.    Marland Kitchen oxyCODONE-acetaminophen (PERCOCET) 10-325 MG tablet Take 1 tablet by mouth every 6 (six) hours as needed for pain.    . promethazine (PHENERGAN) 25 MG tablet Take 25 mg by mouth every 8 (eight) hours as needed for nausea or vomiting.    . triamcinolone cream (KENALOG) 0.1 %   0   Current Facility-Administered Medications on File Prior to Visit  Medication Dose Route Frequency Provider Last Rate Last Dose  . Alcohol (DEHYDRATED ALCOHOL 98%) 98 % injection 0.5 mL  0.5 mL Intravenous Once Alle Difabio, DPM      . triamcinolone acetonide (KENALOG) 10 MG/ML injection 10 mg  10 mg Other Once [employer], DPM      . triamcinolone acetonide (KENALOG) 10 MG/ML injection 10 mg  10 mg Other Once [employer], DPM        No Known Allergies  Objective: Vitals:   04/06/16 1112  BP: 137/74  Pulse: (!) 54  Temp: 97.8 F (36.6 C)   General: No acute distress, AAOx3  Left foot: Sutures intact with no gapping or dehiscence at surgical site, mild swelling to left forefoot, no erythema, no warmth, no drainage, no signs of infection noted, Capillary fill time <3 seconds in all digits,  gross sensation present via light touch to left foot. No pain or crepitation with range of motion left foot.  No pain with calf compression.   Pathology- Neuroma  Post Op Xray,Left foot: No acute bony changes. Soft tissue swelling within normal limits for post op status.   Assessment and Plan:  Problem List Items Addressed This Visit    None    Visit Diagnoses    S/P foot surgery, left    -  Primary   Neuroma digital nerve, left       Relevant Orders   DG Foot Complete Left   Capsulitis       Relevant Orders   DG Foot Complete Left   Left foot pain       Relevant Orders   DG Foot Complete Left      -Patient seen and evaluated -Pathology Xrays  reviewed -Applied dry sterile dressing to surgical site left foot secured with ACE wrap and stockinet  -Advised patient to make sure to keep dressings clean, dry, and intact to left surgical site, removing the ACE as needed  -Patient became nauseous during appointment: patient was placed in trendelenburg position, cool towel provided for head and vitals check; Patient was stabilized and was noted to be feeling better. Patient was sent home and advised if symptoms recur to take phenergan or report to ER  -Advised patient to continue with post-op shoe on left foot   -Advised patient to limit activity to necessity  -Advised patient to ice and elevate as necessary  -Continue with PRN and pain meds as needed -Will plan for suture removal at next office visit. In the meantime, patient to call office if any issues or problems arise.   Landis Martins, DPM

## 2016-04-08 ENCOUNTER — Encounter: Payer: Self-pay | Admitting: Sports Medicine

## 2016-04-13 ENCOUNTER — Ambulatory Visit (INDEPENDENT_AMBULATORY_CARE_PROVIDER_SITE_OTHER): Payer: Medicare Other | Admitting: Sports Medicine

## 2016-04-13 ENCOUNTER — Encounter: Payer: Self-pay | Admitting: Sports Medicine

## 2016-04-13 DIAGNOSIS — Z9889 Other specified postprocedural states: Secondary | ICD-10-CM

## 2016-04-13 DIAGNOSIS — M779 Enthesopathy, unspecified: Secondary | ICD-10-CM

## 2016-04-13 DIAGNOSIS — M79672 Pain in left foot: Secondary | ICD-10-CM

## 2016-04-13 DIAGNOSIS — G5762 Lesion of plantar nerve, left lower limb: Secondary | ICD-10-CM

## 2016-04-13 DIAGNOSIS — G5782 Other specified mononeuropathies of left lower limb: Secondary | ICD-10-CM

## 2016-04-13 MED ORDER — OXYCODONE-ACETAMINOPHEN 10-325 MG PO TABS
1.0000 | ORAL_TABLET | Freq: Four times a day (QID) | ORAL | 0 refills | Status: DC | PRN
Start: 2016-04-13 — End: 2018-04-16

## 2016-04-13 MED ORDER — OXYCODONE-ACETAMINOPHEN 10-325 MG PO TABS: 1.0000 | ORAL_TABLET | Freq: Four times a day (QID) | ORAL | 0 refills | Status: DC | PRN

## 2016-04-13 NOTE — Progress Notes (Signed)
Subjective: Marie Hughes is a 60 y.o. female patient seen today in office for POV #2 (DOS 03-28-16), S/P Left 2nd interspace neurectomy and 2nd MTPJ release with removal of lateral spur at met head. Patient current denies pain at surgical site, admits that she does have pain with walking or standing but its much better than before surgery and is getting better each day; denies calf pain, denies headache, chest pain, shortness of breath, nausea, vomiting, fever, or chills.  No other issues noted.   Patient Active Problem List   Diagnosis Date Noted  . Degenerative cervical spinal stenosis 01/05/2016  . Essential hypertension 01/05/2016  . Lumbar radiculitis 01/05/2016  . Mixed hyperlipidemia 01/05/2016  . Obesity due to excess calories 01/05/2016  . Recurrent major depressive disorder, in partial remission ([place]) 01/05/2016  . Uncomplicated opioid dependence ([place]) 01/05/2016    Current Outpatient Prescriptions on File Prior to Visit  Medication Sig Dispense Refill  . amitriptyline (ELAVIL) 10 MG tablet     . amLODipine (NORVASC) 10 MG tablet Take 10 mg by mouth daily.    Marland Kitchen atorvastatin (LIPITOR) 20 MG tablet   4  . atorvastatin (LIPITOR) 40 MG tablet     . carisoprodol (SOMA) 350 MG tablet   0  . carvedilol (COREG) 12.5 MG tablet Take 12.5 mg by mouth 2 (two) times daily with a meal.    . diazepam (VALIUM) 5 MG tablet Take 5 mg by mouth every 6 (six) hours as needed for anxiety.    . docusate sodium (COLACE) 100 MG capsule Take 100 mg by mouth daily as needed for mild constipation.    Marland Kitchen FLUoxetine (PROZAC) 20 MG capsule   0  . HYDROcodone-acetaminophen (NORCO) 10-325 MG per tablet   0  . KLOR-CON M20 20 MEQ tablet   0  . lisinopril-hydrochlorothiazide (PRINZIDE,ZESTORETIC) 20-12.5 MG per tablet Take 1 tablet by mouth daily.    . meloxicam (MOBIC) 15 MG tablet Take 1 tablet (15 mg total) by mouth daily. 30 tablet 1  . methylPREDNISolone (MEDROL DOSEPAK) 4 MG TBPK tablet Take as instructed  21 tablet 0  . nitrofurantoin (MACRODANTIN) 100 MG capsule     . promethazine (PHENERGAN) 25 MG tablet Take 25 mg by mouth every 8 (eight) hours as needed for nausea or vomiting.    . triamcinolone cream (KENALOG) 0.1 %   0   Current Facility-Administered Medications on File Prior to Visit  Medication Dose Route Frequency Provider Last Rate Last Dose  . Alcohol (DEHYDRATED ALCOHOL 98%) 98 % injection 0.5 mL  0.5 mL Intravenous Once Verle Brillhart, DPM      . triamcinolone acetonide (KENALOG) 10 MG/ML injection 10 mg  10 mg Other Once [employer], DPM      . triamcinolone acetonide (KENALOG) 10 MG/ML injection 10 mg  10 mg Other Once [employer], DPM        No Known Allergies  Objective: There were no vitals filed for this visit. General: No acute distress, AAOx3  Left foot: Sutures intact with mild gapping without dehiscence at surgical site, mild swelling to left forefoot, no erythema, no warmth, no drainage, no signs of infection noted, Capillary fill time <3 seconds in all digits, gross sensation present via light touch to left foot. No pain or crepitation with range of motion left foot.  No pain with calf compression.   Assessment and Plan:  Problem List Items Addressed This Visit    None    Visit Diagnoses    S/P  foot surgery, left    -  Primary   Relevant Medications   oxyCODONE-acetaminophen (PERCOCET) 10-325 MG tablet   Neuroma digital nerve, left       Relevant Medications   oxyCODONE-acetaminophen (PERCOCET) 10-325 MG tablet   Capsulitis       Relevant Medications   oxyCODONE-acetaminophen (PERCOCET) 10-325 MG tablet   Left foot pain       Relevant Medications   oxyCODONE-acetaminophen (PERCOCET) 10-325 MG tablet      -Patient seen and evaluated -Every other suture was removed and applied dry sterile dressing to surgical site left foot secured with ACE wrap and stockinet  -Advised patient to make sure to keep dressings clean, dry, and intact to left surgical  site, removing the ACE as needed  -Advised patient to continue with post-op shoe on left foot   -Advised patient to limit activity to necessity  -Advised patient to ice and elevate as necessary  -Continue with PRN and pain meds as needed; Refilled Percocet  -Will plan for remaninig suture removal at next office visit. In the meantime, patient to call office if any issues or problems arise.   Landis Martins, DPM

## 2016-04-22 ENCOUNTER — Ambulatory Visit (INDEPENDENT_AMBULATORY_CARE_PROVIDER_SITE_OTHER): Payer: Medicare Other | Admitting: Sports Medicine

## 2016-04-22 ENCOUNTER — Encounter: Payer: Self-pay | Admitting: Sports Medicine

## 2016-04-22 DIAGNOSIS — M79672 Pain in left foot: Secondary | ICD-10-CM

## 2016-04-22 DIAGNOSIS — G5782 Other specified mononeuropathies of left lower limb: Secondary | ICD-10-CM

## 2016-04-22 DIAGNOSIS — Z9889 Other specified postprocedural states: Secondary | ICD-10-CM

## 2016-04-22 DIAGNOSIS — M779 Enthesopathy, unspecified: Secondary | ICD-10-CM

## 2016-04-22 DIAGNOSIS — G5762 Lesion of plantar nerve, left lower limb: Secondary | ICD-10-CM

## 2016-04-22 NOTE — Patient Instructions (Signed)
Range of motion exercises with marbles daily May soak with epsom salt 2x weekly starting next week Use compression sock daily until next visit

## 2016-04-22 NOTE — Progress Notes (Signed)
Subjective: Marie Hughes is a 60 y.o. female patient seen today in office for POV #3 (DOS 03-28-16), S/P Left 2nd interspace neurectomy and 2nd MTPJ release with removal of lateral spur at met head. Patient current denies pain at surgical site, admits that she does feel like that is a ring around the toe and unable to bend toes; denies calf pain, denies headache, chest pain, shortness of breath, nausea, vomiting, fever, or chills.  No other issues noted.   Patient Active Problem List   Diagnosis Date Noted  . Degenerative cervical spinal stenosis 01/05/2016  . Essential hypertension 01/05/2016  . Lumbar radiculitis 01/05/2016  . Mixed hyperlipidemia 01/05/2016  . Obesity due to excess calories 01/05/2016  . Recurrent major depressive disorder, in partial remission ([place]) 01/05/2016  . Uncomplicated opioid dependence ([place]) 01/05/2016    Current Outpatient Prescriptions on File Prior to Visit  Medication Sig Dispense Refill  . amitriptyline (ELAVIL) 10 MG tablet     . amLODipine (NORVASC) 10 MG tablet Take 10 mg by mouth daily.    Marland Kitchen atorvastatin (LIPITOR) 20 MG tablet   4  . atorvastatin (LIPITOR) 40 MG tablet     . carisoprodol (SOMA) 350 MG tablet   0  . carvedilol (COREG) 12.5 MG tablet Take 12.5 mg by mouth 2 (two) times daily with a meal.    . diazepam (VALIUM) 5 MG tablet Take 5 mg by mouth every 6 (six) hours as needed for anxiety.    . docusate sodium (COLACE) 100 MG capsule Take 100 mg by mouth daily as needed for mild constipation.    Marland Kitchen FLUoxetine (PROZAC) 20 MG capsule   0  . HYDROcodone-acetaminophen (NORCO) 10-325 MG per tablet   0  . KLOR-CON M20 20 MEQ tablet   0  . lisinopril-hydrochlorothiazide (PRINZIDE,ZESTORETIC) 20-12.5 MG per tablet Take 1 tablet by mouth daily.    . meloxicam (MOBIC) 15 MG tablet Take 1 tablet (15 mg total) by mouth daily. 30 tablet 1  . methylPREDNISolone (MEDROL DOSEPAK) 4 MG TBPK tablet Take as instructed 21 tablet 0  . nitrofurantoin  (MACRODANTIN) 100 MG capsule     . oxyCODONE-acetaminophen (PERCOCET) 10-325 MG tablet Take 1 tablet by mouth every 6 (six) hours as needed for pain. 15 tablet 0  . oxyCODONE-acetaminophen (PERCOCET) 10-325 MG tablet Take 1 tablet by mouth every 6 (six) hours as needed for pain. 15 tablet 0  . promethazine (PHENERGAN) 25 MG tablet Take 25 mg by mouth every 8 (eight) hours as needed for nausea or vomiting.    . triamcinolone cream (KENALOG) 0.1 %   0   Current Facility-Administered Medications on File Prior to Visit  Medication Dose Route Frequency Provider Last Rate Last Dose  . Alcohol (DEHYDRATED ALCOHOL 98%) 98 % injection 0.5 mL  0.5 mL Intravenous Once Beau Ramsburg, DPM      . triamcinolone acetonide (KENALOG) 10 MG/ML injection 10 mg  10 mg Other Once [employer], DPM      . triamcinolone acetonide (KENALOG) 10 MG/ML injection 10 mg  10 mg Other Once [employer], DPM        No Known Allergies  Objective: There were no vitals filed for this visit. General: No acute distress, AAOx3  Left foot: Remaining sutures intact with no gapping or dehiscence at surgical site, mild swelling to left forefoot, no erythema, no warmth, no drainage, no signs of infection noted, Capillary fill time <3 seconds in all digits, gross sensation present via light touch to left  foot. No pain or crepitation with range of motion left foot.  No pain with calf compression.   Assessment and Plan:  Problem List Items Addressed This Visit    None    Visit Diagnoses    S/P foot surgery, left    -  Primary   Neuroma digital nerve, left       Capsulitis       Left foot pain          -Patient seen and evaluated -All remaning sutures were removed and applied dry sterile dressing to surgical site left foot secured with ACE wrap and stockinet  -Advised patient to make sure to keep dressings clean, dry, and intact to left surgical site, for today and then may shower as normal allowing steri-strips to fall off  on there own -Advised to use surgi-tube stocking for edema control -Advised patient to continue with post-op shoe on left foot until next visit -Advised patient to limit activity to necessity and to do soaking and range of motion exercises -Advised patient to ice and elevate as necessary  -Continue with PRN and pain meds as needed -Continue with no work until next office visit -Will plan for transition to normal shoes depending on swelling at next office visit. In the meantime, patient to call office if any issues or problems arise.   Landis Martins, DPM

## 2016-05-12 ENCOUNTER — Encounter: Payer: Self-pay | Admitting: Sports Medicine

## 2016-05-12 ENCOUNTER — Ambulatory Visit (INDEPENDENT_AMBULATORY_CARE_PROVIDER_SITE_OTHER): Payer: Medicare Other | Admitting: Sports Medicine

## 2016-05-12 DIAGNOSIS — G5782 Other specified mononeuropathies of left lower limb: Secondary | ICD-10-CM

## 2016-05-12 DIAGNOSIS — G5762 Lesion of plantar nerve, left lower limb: Secondary | ICD-10-CM

## 2016-05-12 DIAGNOSIS — M7989 Other specified soft tissue disorders: Secondary | ICD-10-CM

## 2016-05-12 DIAGNOSIS — M79672 Pain in left foot: Secondary | ICD-10-CM

## 2016-05-12 DIAGNOSIS — M779 Enthesopathy, unspecified: Secondary | ICD-10-CM

## 2016-05-12 DIAGNOSIS — Z9889 Other specified postprocedural states: Secondary | ICD-10-CM | POA: Diagnosis not present

## 2016-05-12 NOTE — Progress Notes (Signed)
Subjective: Marie Hughes is a 60 y.o. female patient seen today in office for POV #4 (DOS 03-28-16), S/P Left 2nd interspace neurectomy and 2nd MTPJ release with removal of lateral spur at met head. Patient denies current pain at surgical site, admits that she does feel like that is a ring around the toe as previous and is now able to bend toes more but has been doing more walking and standing with swelling at end of day; denies calf pain, denies headache, chest pain, shortness of breath, nausea, vomiting, fever, or chills.  No other issues noted.   Patient Active Problem List   Diagnosis Date Noted  . Degenerative cervical spinal stenosis 01/05/2016  . Essential hypertension 01/05/2016  . Lumbar radiculitis 01/05/2016  . Mixed hyperlipidemia 01/05/2016  . Obesity due to excess calories 01/05/2016  . Recurrent major depressive disorder, in partial remission ([place]) 01/05/2016  . Uncomplicated opioid dependence ([place]) 01/05/2016    Current Outpatient Prescriptions on File Prior to Visit  Medication Sig Dispense Refill  . amitriptyline (ELAVIL) 10 MG tablet     . amLODipine (NORVASC) 10 MG tablet Take 10 mg by mouth daily.    Marland Kitchen atorvastatin (LIPITOR) 20 MG tablet   4  . atorvastatin (LIPITOR) 40 MG tablet     . carisoprodol (SOMA) 350 MG tablet   0  . carvedilol (COREG) 12.5 MG tablet Take 12.5 mg by mouth 2 (two) times daily with a meal.    . diazepam (VALIUM) 5 MG tablet Take 5 mg by mouth every 6 (six) hours as needed for anxiety.    . docusate sodium (COLACE) 100 MG capsule Take 100 mg by mouth daily as needed for mild constipation.    Marland Kitchen FLUoxetine (PROZAC) 20 MG capsule   0  . HYDROcodone-acetaminophen (NORCO) 10-325 MG per tablet   0  . KLOR-CON M20 20 MEQ tablet   0  . lisinopril-hydrochlorothiazide (PRINZIDE,ZESTORETIC) 20-12.5 MG per tablet Take 1 tablet by mouth daily.    . meloxicam (MOBIC) 15 MG tablet Take 1 tablet (15 mg total) by mouth daily. 30 tablet 1  .  methylPREDNISolone (MEDROL DOSEPAK) 4 MG TBPK tablet Take as instructed 21 tablet 0  . nitrofurantoin (MACRODANTIN) 100 MG capsule     . oxyCODONE-acetaminophen (PERCOCET) 10-325 MG tablet Take 1 tablet by mouth every 6 (six) hours as needed for pain. 15 tablet 0  . oxyCODONE-acetaminophen (PERCOCET) 10-325 MG tablet Take 1 tablet by mouth every 6 (six) hours as needed for pain. 15 tablet 0  . promethazine (PHENERGAN) 25 MG tablet Take 25 mg by mouth every 8 (eight) hours as needed for nausea or vomiting.    . triamcinolone cream (KENALOG) 0.1 %   0   Current Facility-Administered Medications on File Prior to Visit  Medication Dose Route Frequency Provider Last Rate Last Dose  . Alcohol (DEHYDRATED ALCOHOL 98%) 98 % injection 0.5 mL  0.5 mL Intravenous Once Titorya Stover, DPM      . triamcinolone acetonide (KENALOG) 10 MG/ML injection 10 mg  10 mg Other Once [employer], DPM      . triamcinolone acetonide (KENALOG) 10 MG/ML injection 10 mg  10 mg Other Once [employer], DPM        No Known Allergies  Objective: There were no vitals filed for this visit. General: No acute distress, AAOx3  Left foot: Incision intact with no gapping or dehiscence at surgical site, mild swelling to left forefoot, no erythema, no warmth, no drainage, no signs of  infection noted, Capillary fill time <3 seconds in all digits, gross sensation present via light touch to left foot. No pain or crepitation with range of motion left foot.  No pain with calf compression.   Assessment and Plan:  Problem List Items Addressed This Visit    None    Visit Diagnoses    S/P foot surgery, left    -  Primary   Neuroma digital nerve, left       Capsulitis       Left foot pain       Foot swelling          -Patient seen and evaluated -Toe coban applied to assist with edema control to toes -Advised to use compression anklet as dispnesed for edema control -Advised patient to continue with post-op shoe on left foot  with slow transition over the next week -Advised patient to limit activity to necessity and to do soaking and range of motion exercises -Advised patient to ice and elevate as necessary  -Continue with PRN and pain meds as needed -Patient may return to work in 2 weeks and slowly progress to duties as tolerated if in normal shoe -Patient to return for recheck in 3 weeks. In the meantime, patient to call office if any issues or problems arise.   Landis Martins, DPM

## 2016-05-13 ENCOUNTER — Encounter: Payer: Medicare Other | Admitting: Sports Medicine

## 2016-05-19 ENCOUNTER — Encounter: Payer: Self-pay | Admitting: Sports Medicine

## 2016-06-02 ENCOUNTER — Ambulatory Visit (INDEPENDENT_AMBULATORY_CARE_PROVIDER_SITE_OTHER): Payer: Self-pay | Admitting: Sports Medicine

## 2016-06-02 ENCOUNTER — Encounter: Payer: Self-pay | Admitting: Sports Medicine

## 2016-06-02 DIAGNOSIS — G5762 Lesion of plantar nerve, left lower limb: Secondary | ICD-10-CM

## 2016-06-02 DIAGNOSIS — M779 Enthesopathy, unspecified: Secondary | ICD-10-CM

## 2016-06-02 DIAGNOSIS — G5782 Other specified mononeuropathies of left lower limb: Secondary | ICD-10-CM

## 2016-06-02 DIAGNOSIS — Z9889 Other specified postprocedural states: Secondary | ICD-10-CM

## 2016-06-02 DIAGNOSIS — M7989 Other specified soft tissue disorders: Secondary | ICD-10-CM

## 2016-06-02 DIAGNOSIS — M79672 Pain in left foot: Secondary | ICD-10-CM

## 2016-06-02 DIAGNOSIS — L905 Scar conditions and fibrosis of skin: Secondary | ICD-10-CM

## 2016-06-02 NOTE — Progress Notes (Signed)
Subjective: Marie Hughes is a 60 y.o. female patient seen today in office for POV #5 (DOS 03-28-16), S/P Left 2nd interspace neurectomy and 2nd MTPJ release with removal of lateral spur at met head. Patient denies current pain at surgical site, admits that she does feel like that is a ring around the toe as previous and is now able to bend toes more and is feeling better working 4 hours that will increase to 6 next week; denies calf pain, denies headache, chest pain, shortness of breath, nausea, vomiting, fever, or chills. No other issues noted.   Patient Active Problem List   Diagnosis Date Noted  . Degenerative cervical spinal stenosis 01/05/2016  . Essential hypertension 01/05/2016  . Lumbar radiculitis 01/05/2016  . Mixed hyperlipidemia 01/05/2016  . Obesity due to excess calories 01/05/2016  . Recurrent major depressive disorder, in partial remission ([place]) 01/05/2016  . Uncomplicated opioid dependence ([place]) 01/05/2016    Current Outpatient Prescriptions on File Prior to Visit  Medication Sig Dispense Refill  . amitriptyline (ELAVIL) 10 MG tablet     . amLODipine (NORVASC) 10 MG tablet Take 10 mg by mouth daily.    Marland Kitchen atorvastatin (LIPITOR) 20 MG tablet   4  . atorvastatin (LIPITOR) 40 MG tablet     . carisoprodol (SOMA) 350 MG tablet   0  . carvedilol (COREG) 12.5 MG tablet Take 12.5 mg by mouth 2 (two) times daily with a meal.    . diazepam (VALIUM) 5 MG tablet Take 5 mg by mouth every 6 (six) hours as needed for anxiety.    . docusate sodium (COLACE) 100 MG capsule Take 100 mg by mouth daily as needed for mild constipation.    Marland Kitchen FLUoxetine (PROZAC) 20 MG capsule   0  . HYDROcodone-acetaminophen (NORCO) 10-325 MG per tablet   0  . KLOR-CON M20 20 MEQ tablet   0  . lisinopril-hydrochlorothiazide (PRINZIDE,ZESTORETIC) 20-12.5 MG per tablet Take 1 tablet by mouth daily.    . meloxicam (MOBIC) 15 MG tablet Take 1 tablet (15 mg total) by mouth daily. 30 tablet 1  . methylPREDNISolone  (MEDROL DOSEPAK) 4 MG TBPK tablet Take as instructed 21 tablet 0  . nitrofurantoin (MACRODANTIN) 100 MG capsule     . oxyCODONE-acetaminophen (PERCOCET) 10-325 MG tablet Take 1 tablet by mouth every 6 (six) hours as needed for pain. 15 tablet 0  . oxyCODONE-acetaminophen (PERCOCET) 10-325 MG tablet Take 1 tablet by mouth every 6 (six) hours as needed for pain. 15 tablet 0  . promethazine (PHENERGAN) 25 MG tablet Take 25 mg by mouth every 8 (eight) hours as needed for nausea or vomiting.    . triamcinolone cream (KENALOG) 0.1 %   0   Current Facility-Administered Medications on File Prior to Visit  Medication Dose Route Frequency Provider Last Rate Last Dose  . Alcohol (DEHYDRATED ALCOHOL 98%) 98 % injection 0.5 mL  0.5 mL Intravenous Once Demiah Gullickson, DPM      . triamcinolone acetonide (KENALOG) 10 MG/ML injection 10 mg  10 mg Other Once [employer], DPM      . triamcinolone acetonide (KENALOG) 10 MG/ML injection 10 mg  10 mg Other Once [employer], DPM        No Known Allergies  Objective: There were no vitals filed for this visit. General: No acute distress, AAOx3  Left foot: Incision healed with no gapping or dehiscence at surgical site, mild scar, mild swelling to left forefoot, no erythema, no warmth, no drainage, no signs  of infection noted, Capillary fill time <3 seconds in all digits, gross sensation present via light touch to left foot. No pain or crepitation with range of motion left foot. Splay of digits.  No pain with calf compression.   Assessment and Plan:  Problem List Items Addressed This Visit    None    Visit Diagnoses    S/P foot surgery, left    -  Primary   Neuroma digital nerve, left       Capsulitis       Left foot pain       Foot swelling       Scar          -Patient seen and evaluated -Continue toe coban to splint toes and to assist with edema control to toes -Advised to continue to use compression anklet as dispnesed for edema control -Continue  with normal shoes as tolerated -Advised patient to limit activity to tolerance and to do soaking and range of motion exercises -Advised patient to ice and elevate as necessary  -Continue with work with increase in hours to tolerance -Patient to return for recheck in 8 weeks for xray and final post op check. In the meantime, patient to call office if any issues or problems arise.   Landis Martins, DPM

## 2016-08-04 ENCOUNTER — Ambulatory Visit (INDEPENDENT_AMBULATORY_CARE_PROVIDER_SITE_OTHER): Payer: Self-pay | Admitting: Sports Medicine

## 2016-08-04 ENCOUNTER — Ambulatory Visit (INDEPENDENT_AMBULATORY_CARE_PROVIDER_SITE_OTHER): Payer: Medicare Other

## 2016-08-04 ENCOUNTER — Encounter: Payer: Self-pay | Admitting: Sports Medicine

## 2016-08-04 DIAGNOSIS — M79672 Pain in left foot: Secondary | ICD-10-CM | POA: Diagnosis not present

## 2016-08-04 DIAGNOSIS — M7989 Other specified soft tissue disorders: Secondary | ICD-10-CM

## 2016-08-04 DIAGNOSIS — G5762 Lesion of plantar nerve, left lower limb: Secondary | ICD-10-CM | POA: Diagnosis not present

## 2016-08-04 DIAGNOSIS — G5782 Other specified mononeuropathies of left lower limb: Secondary | ICD-10-CM

## 2016-08-04 DIAGNOSIS — Z9889 Other specified postprocedural states: Secondary | ICD-10-CM

## 2016-08-04 DIAGNOSIS — M779 Enthesopathy, unspecified: Secondary | ICD-10-CM

## 2016-08-04 NOTE — Progress Notes (Signed)
Subjective: Marie Hughes is a 61 y.o. female patient seen today in office for POV #5 (DOS 03-28-16), S/P Left 2nd interspace neurectomy and 2nd MTPJ release with removal of lateral spur at met head. Patient denies current pain at surgical site; denies calf pain, denies headache, chest pain, shortness of breath, nausea, vomiting, fever, or chills. No other issues noted.   Patient Active Problem List   Diagnosis Date Noted  . Degenerative cervical spinal stenosis 01/05/2016  . Essential hypertension 01/05/2016  . Lumbar radiculitis 01/05/2016  . Mixed hyperlipidemia 01/05/2016  . Obesity due to excess calories 01/05/2016  . Recurrent major depressive disorder, in partial remission ([place]) 01/05/2016  . Uncomplicated opioid dependence ([place]) 01/05/2016    Current Outpatient Prescriptions on File Prior to Visit  Medication Sig Dispense Refill  . amitriptyline (ELAVIL) 10 MG tablet     . amLODipine (NORVASC) 10 MG tablet Take 10 mg by mouth daily.    Marland Kitchen atorvastatin (LIPITOR) 20 MG tablet   4  . atorvastatin (LIPITOR) 40 MG tablet     . carisoprodol (SOMA) 350 MG tablet   0  . carvedilol (COREG) 12.5 MG tablet Take 12.5 mg by mouth 2 (two) times daily with a meal.    . diazepam (VALIUM) 5 MG tablet Take 5 mg by mouth every 6 (six) hours as needed for anxiety.    . docusate sodium (COLACE) 100 MG capsule Take 100 mg by mouth daily as needed for mild constipation.    Marland Kitchen FLUoxetine (PROZAC) 20 MG capsule   0  . HYDROcodone-acetaminophen (NORCO) 10-325 MG per tablet   0  . KLOR-CON M20 20 MEQ tablet   0  . lisinopril-hydrochlorothiazide (PRINZIDE,ZESTORETIC) 20-12.5 MG per tablet Take 1 tablet by mouth daily.    . meloxicam (MOBIC) 15 MG tablet Take 1 tablet (15 mg total) by mouth daily. 30 tablet 1  . methylPREDNISolone (MEDROL DOSEPAK) 4 MG TBPK tablet Take as instructed 21 tablet 0  . nitrofurantoin (MACRODANTIN) 100 MG capsule     . oxyCODONE-acetaminophen (PERCOCET) 10-325 MG tablet Take 1  tablet by mouth every 6 (six) hours as needed for pain. 15 tablet 0  . oxyCODONE-acetaminophen (PERCOCET) 10-325 MG tablet Take 1 tablet by mouth every 6 (six) hours as needed for pain. 15 tablet 0  . promethazine (PHENERGAN) 25 MG tablet Take 25 mg by mouth every 8 (eight) hours as needed for nausea or vomiting.    . triamcinolone cream (KENALOG) 0.1 %   0   Current Facility-Administered Medications on File Prior to Visit  Medication Dose Route Frequency Provider Last Rate Last Dose  . Alcohol (DEHYDRATED ALCOHOL 98%) 98 % injection 0.5 mL  0.5 mL Intravenous Once Krrish Freund, DPM      . triamcinolone acetonide (KENALOG) 10 MG/ML injection 10 mg  10 mg Other Once [employer], DPM      . triamcinolone acetonide (KENALOG) 10 MG/ML injection 10 mg  10 mg Other Once [employer], DPM        No Known Allergies  Objective: There were no vitals filed for this visit. General: No acute distress, AAOx3  Left foot: Incision well healed with no gapping or dehiscence at surgical site, mild scar, mild swelling to left forefoot, no erythema, no warmth, no drainage, no signs of infection noted, Capillary fill time <3 seconds in all digits, gross sensation present via light touch to left foot. No pain or crepitation with range of motion left foot. Splay of digits.  No pain  with calf compression.   Xrays left foot- no acute findings, mild medial deviation of the 2nd toe.   Assessment and Plan:  Problem List Items Addressed This Visit    None    Visit Diagnoses    S/P foot surgery, left    -  Primary   Relevant Orders   DG Foot Complete Left (Completed)   Neuroma digital nerve, left       Relevant Orders   DG Foot Complete Left (Completed)   Capsulitis       Relevant Orders   DG Foot Complete Left (Completed)   Left foot pain       Relevant Orders   DG Foot Complete Left (Completed)   Foot swelling       Relevant Orders   DG Foot Complete Left (Completed)      -Patient seen and  evaluated  -xrays reviewed  -Continue toe coban to splint toes and to assist with edema control to toes as needed  -Advised to continue to use compression anklet as dispnesed for edema control as needed -Continue with normal shoes  -Advised patient normal activities with no restriction s -Advised patient to ice and elevate as necessary  -Patient to return as needed. In the meantime, patient to call office if any issues or problems arise.   Landis Martins, DPM

## 2016-12-20 DIAGNOSIS — Z79891 Long term (current) use of opiate analgesic: Secondary | ICD-10-CM | POA: Insufficient documentation

## 2016-12-20 HISTORY — DX: Long term (current) use of opiate analgesic: Z79.891

## 2017-08-21 DIAGNOSIS — G8929 Other chronic pain: Secondary | ICD-10-CM

## 2017-08-21 DIAGNOSIS — M51369 Other intervertebral disc degeneration, lumbar region without mention of lumbar back pain or lower extremity pain: Secondary | ICD-10-CM

## 2017-08-21 DIAGNOSIS — M5136 Other intervertebral disc degeneration, lumbar region: Secondary | ICD-10-CM | POA: Insufficient documentation

## 2017-08-21 DIAGNOSIS — M62830 Muscle spasm of back: Secondary | ICD-10-CM

## 2017-08-21 DIAGNOSIS — M961 Postlaminectomy syndrome, not elsewhere classified: Secondary | ICD-10-CM

## 2017-08-21 HISTORY — DX: Other chronic pain: G89.29

## 2017-08-21 HISTORY — DX: Postlaminectomy syndrome, not elsewhere classified: M96.1

## 2017-08-21 HISTORY — DX: Other intervertebral disc degeneration, lumbar region: M51.36

## 2017-08-21 HISTORY — DX: Other intervertebral disc degeneration, lumbar region without mention of lumbar back pain or lower extremity pain: M51.369

## 2017-08-21 HISTORY — DX: Muscle spasm of back: M62.830

## 2017-12-21 DIAGNOSIS — I519 Heart disease, unspecified: Secondary | ICD-10-CM

## 2017-12-21 DIAGNOSIS — E661 Drug-induced obesity: Secondary | ICD-10-CM

## 2017-12-21 DIAGNOSIS — R7301 Impaired fasting glucose: Secondary | ICD-10-CM | POA: Insufficient documentation

## 2017-12-21 DIAGNOSIS — E66812 Obesity, class 2: Secondary | ICD-10-CM | POA: Insufficient documentation

## 2017-12-21 DIAGNOSIS — R079 Chest pain, unspecified: Secondary | ICD-10-CM

## 2017-12-21 DIAGNOSIS — Z6839 Body mass index (BMI) 39.0-39.9, adult: Secondary | ICD-10-CM

## 2017-12-21 HISTORY — DX: Impaired fasting glucose: R73.01

## 2017-12-21 HISTORY — DX: Body mass index (BMI) 39.0-39.9, adult: Z68.39

## 2017-12-21 HISTORY — DX: Heart disease, unspecified: I51.9

## 2017-12-21 HISTORY — DX: Chest pain, unspecified: R07.9

## 2017-12-21 HISTORY — DX: Drug-induced obesity: E66.1

## 2018-03-22 ENCOUNTER — Ambulatory Visit: Payer: Self-pay | Admitting: Cardiology

## 2018-03-30 ENCOUNTER — Ambulatory Visit: Payer: Self-pay | Admitting: Cardiology

## 2018-04-16 ENCOUNTER — Encounter: Payer: Self-pay | Admitting: Cardiology

## 2018-04-16 ENCOUNTER — Ambulatory Visit: Payer: Medicare PPO | Admitting: Cardiology

## 2018-04-16 VITALS — BP 122/82 | HR 52 | Ht 61.0 in | Wt 209.0 lb

## 2018-04-16 DIAGNOSIS — I10 Essential (primary) hypertension: Secondary | ICD-10-CM

## 2018-04-16 DIAGNOSIS — R079 Chest pain, unspecified: Secondary | ICD-10-CM

## 2018-04-16 DIAGNOSIS — E782 Mixed hyperlipidemia: Secondary | ICD-10-CM

## 2018-04-16 DIAGNOSIS — R0609 Other forms of dyspnea: Secondary | ICD-10-CM

## 2018-04-16 HISTORY — DX: Morbid (severe) obesity due to excess calories: E66.01

## 2018-04-16 NOTE — Progress Notes (Signed)
Cardiology Office Note:    Date:  04/16/2018   ID:  Marie Hughes, DOB [birth year], MRN [id number]  PCP:  Claudean Severance, MD  Cardiologist:  Garwin Brothers, MD   Referring MD: Claudean Severance, MD    ASSESSMENT:    1. Essential hypertension   2. Mixed hyperlipidemia   3. Morbid obesity (HCC)   4. DOE (dyspnea on exertion)    PLAN:    In order of problems listed above:  1. Primary prevention stressed with the patient.  Importance of compliance with diet and medication stressed and she vocalized understanding. 2. Diet was discussed for obesity and dyslipidemia and risk of obesity explained and she vocalized understanding.  She plans to work hard to reduce her weight.  Her blood pressure is stable. 3. Echocardiogram will be done to assess murmur heard on auscultation.  She will undergo Lexiscan sestamibi testing to assess her symptoms of shortness of breath on exertion. 4. Patient will be seen in follow-up appointment in 6 weeks or earlier if the patient has any concerns 5. Sublingual nitroglycerin prescription was sent, its protocol and 911 protocol explained and the patient vocalized understanding questions were answered to the patient's satisfaction.   Medication Adjustments/Labs and Tests Ordered: Current medicines are reviewed at length with the patient today.  Concerns regarding medicines are outlined above.  No orders of the defined types were placed in this encounter.  No orders of the defined types were placed in this encounter.    History of Present Illness:    Marie Hughes is a 62 y.o. female who is being seen today for the evaluation of shortness of breath on exertion at the request of Claudean Severance, MD.  Patient is a pleasant 62 year old female.  She has past medical history of essential hypertension, dyslipidemia and morbid obesity.  She has left bundle branch block on the EKG.  She mentions to me that over the past several weeks she is having shortness of breath  on exertion.  She occasionally has shortness of breath laying down also and she sits up to feel better.  She denies any paroxysmal nocturnal dyspnea.  No chest tightness or chest pain.  She occasionally has chest discomfort not related to exertion with no radiation to any part of the body.  At the time of my evaluation, the patient is alert awake oriented and in no distress.  Past Medical History:  Diagnosis Date  . Bladder problem   . Depression   . Hypertension   . Muscle pain   . Swelling     Past Surgical History:  Procedure Laterality Date  . ABDOMINAL HYSTERECTOMY    . FOOT SURGERY    . HAND SURGERY     BOTH HANDS  . NECK SURGERIES      Current Medications: Current Meds  Medication Sig  . amitriptyline (ELAVIL) 10 MG tablet Take 10 mg by mouth at bedtime as needed.   Marland Kitchen amLODipine (NORVASC) 10 MG tablet Take 10 mg by mouth daily.  . carisoprodol (SOMA) 350 MG tablet Take 350 mg by mouth 3 (three) times daily.   . carvedilol (COREG) 12.5 MG tablet Take 12.5 mg by mouth 2 (two) times daily with a meal.  . HYDROcodone-acetaminophen (NORCO) 10-325 MG per tablet   . KLOR-CON M20 20 MEQ tablet   . lisinopril-hydrochlorothiazide (PRINZIDE,ZESTORETIC) 20-12.5 MG per tablet Take 1 tablet by mouth daily.  . nitroGLYCERIN (NITROSTAT) 0.3 MG SL tablet Place 1 tablet under the tongue every 5 (five) minutes  as needed.  . rosuvastatin (CRESTOR) 20 MG tablet Take 1 tablet by mouth daily.   Current Facility-Administered Medications for the 04/16/18 encounter (Office Visit) with Revankar, Aundra Dubin, MD  Medication  . Alcohol (DEHYDRATED ALCOHOL 98%) 98 % injection 0.5 mL  . triamcinolone acetonide (KENALOG) 10 MG/ML injection 10 mg  . triamcinolone acetonide (KENALOG) 10 MG/ML injection 10 mg     Allergies:   Patient has no known allergies.   Social History   Socioeconomic History  . Marital status: Married    Spouse name: Not on file  . Number of children: Not on file  . Years of  education: Not on file  . Highest education level: Not on file  Occupational History  . Not on file  Social Needs  . Financial resource strain: Not on file  . Food insecurity:    Worry: Not on file    Inability: Not on file  . Transportation needs:    Medical: Not on file    Non-medical: Not on file  Tobacco Use  . Smoking status: Never Smoker  . Smokeless tobacco: Never Used  Substance and Sexual Activity  . Alcohol use: No    Alcohol/week: 0.0 standard drinks  . Drug use: No  . Sexual activity: Not on file  Lifestyle  . Physical activity:    Days per week: Not on file    Minutes per session: Not on file  . Stress: Not on file  Relationships  . Social connections:    Talks on phone: Not on file    Gets together: Not on file    Attends religious service: Not on file    Active member of club or organization: Not on file    Attends meetings of clubs or organizations: Not on file    Relationship status: Not on file  Other Topics Concern  . Not on file  Social History Narrative  . Not on file     Family History: The patient's family history is not on file.  ROS:   Please see the history of present illness.    All other systems reviewed and are negative.  EKGs/Labs/Other Studies Reviewed:    The following studies were reviewed today: EKG reveals sinus rhythm and left bundle branch block.   Recent Labs: No results found for requested labs within last 8760 hours.  Recent Lipid Panel No results found for: CHOL, TRIG, HDL, CHOLHDL, VLDL, LDLCALC, LDLDIRECT  Physical Exam:    VS:  BP 122/82 (BP Location: Right Arm, Patient Position: Sitting, Cuff Size: Normal)   Pulse (!) 52   Ht 5\' 1"  (1.549 m)   Wt 209 lb (94.8 kg)   SpO2 99%   BMI 39.49 kg/m     Wt Readings from Last 3 Encounters:  04/16/18 209 lb (94.8 kg)  02/24/16 205 lb (93 kg)     GEN: Patient is in no acute distress HEENT: Normal NECK: No JVD; No carotid bruits LYMPHATICS: No  lymphadenopathy CARDIAC: S1 S2 regular, 2/6 systolic murmur at the apex. RESPIRATORY:  Clear to auscultation without rales, wheezing or rhonchi  ABDOMEN: Soft, non-tender, non-distended MUSCULOSKELETAL:  No edema; No deformity  SKIN: Warm and dry NEUROLOGIC:  Alert and oriented x 3 PSYCHIATRIC:  Normal affect    Signed, 02/26/16, MD  04/16/2018 4:49 PM    [facility]

## 2018-04-16 NOTE — Patient Instructions (Signed)
Medication Instructions:  Your physician recommends that you continue on your current medications as directed. Please refer to the Current Medication list given to you today.  If you need a refill on your cardiac medications before your next appointment, please call your pharmacy.   Lab work: None  If you have labs (blood work) drawn today and your tests are completely normal, you will receive your results only by: . MyChart Message (if you have MyChart) OR . A paper copy in the mail If you have any lab test that is abnormal or we need to change your treatment, we will call you to review the results.  Testing/Procedures: Your physician has requested that you have an echocardiogram. Echocardiography is a painless test that uses sound waves to create images of your heart. It provides your doctor with information about the size and shape of your heart and how well your heart's chambers and valves are working. This procedure takes approximately one hour. There are no restrictions for this procedure.  Your physician has requested that you have a lexiscan myoview. For further information please visit [email or web address]. Please follow instruction sheet, as given.  Follow-Up: At [facility], you and your health needs are our priority.  As part of our continuing mission to provide you with exceptional heart care, we have created designated Provider Care Teams.  These Care Teams include your primary Cardiologist (physician) and Advanced Practice Providers (APPs -  Physician Assistants and Nurse Practitioners) who all work together to provide you with the care you need, when you need it.  You will need a follow up appointment in 3 months.  Please call our office 2 months in advance to schedule this appointment.  You may see another member of our [facility] Provider Team in [place]: Robert Krasowski, MD . Brian Munley, MD  Any Other Special Instructions Will Be Listed Below (If  Applicable).    

## 2018-05-08 ENCOUNTER — Telehealth: Payer: Self-pay | Admitting: *Deleted

## 2018-05-08 NOTE — Telephone Encounter (Signed)
Left message on voicemail per DPR in reference to upcoming appointment scheduled on 05/15/18 at 0830 with detailed instructions given per Myocardial Perfusion Study Information Sheet for the test. LM to arrive 15 minutes early, and that it is imperative to arrive on time for appointment to keep from having the test rescheduled. If you need to cancel or reschedule your appointment, please call the office within 24 hours of your appointment. Failure to do so may result in a cancellation of your appointment, and a $50 no show fee. Phone number given for call back for any questions. Lynesha Bango, Adelene Idler

## 2018-05-15 ENCOUNTER — Ambulatory Visit (INDEPENDENT_AMBULATORY_CARE_PROVIDER_SITE_OTHER): Payer: Medicare PPO

## 2018-05-15 VITALS — Ht 61.0 in | Wt 209.0 lb

## 2018-05-15 DIAGNOSIS — R0609 Other forms of dyspnea: Secondary | ICD-10-CM

## 2018-05-15 DIAGNOSIS — R079 Chest pain, unspecified: Secondary | ICD-10-CM

## 2018-05-15 MED ORDER — TECHNETIUM TC 99M TETROFOSMIN IV KIT
30.7000 | PACK | Freq: Once | INTRAVENOUS | Status: AC | PRN
  Administered 2018-05-15: 30.7 via INTRAVENOUS

## 2018-05-15 MED ORDER — REGADENOSON 0.4 MG/5ML IV SOLN
0.4000 mg | Freq: Once | INTRAVENOUS | Status: AC
  Administered 2018-05-15: 0.4 mg via INTRAVENOUS

## 2018-05-16 ENCOUNTER — Ambulatory Visit: Payer: Medicare PPO

## 2018-05-16 MED ORDER — TECHNETIUM TC 99M TETROFOSMIN IV KIT
31.5000 | PACK | Freq: Once | INTRAVENOUS | Status: AC | PRN
  Administered 2018-05-16: 31.5 via INTRAVENOUS

## 2018-05-18 ENCOUNTER — Telehealth: Payer: Self-pay

## 2018-05-18 LAB — MYOCARDIAL PERFUSION IMAGING
CHL CUP NUCLEAR SRS: 7
CHL CUP NUCLEAR SSS: 11
CSEPPHR: 91 {beats}/min
LV dias vol: 110 mL (ref 46–106)
LVSYSVOL: 46 mL
NUC STRESS TID: 0.94
Rest HR: 62 {beats}/min
SDS: 4

## 2018-05-18 NOTE — Telephone Encounter (Signed)
-----   Message from Garwin Brothers, MD sent at 05/18/2018 10:54 AM EST ----- The results of the study is unremarkable. Please inform patient. I will discuss in detail at next appointment. Cc  primary care/referring physician Garwin Brothers, MD 05/18/2018 10:54 AM

## 2018-05-18 NOTE — Telephone Encounter (Signed)
Called patient and left detailed voice message on phone regarding test results. 

## 2018-05-28 ENCOUNTER — Ambulatory Visit (INDEPENDENT_AMBULATORY_CARE_PROVIDER_SITE_OTHER): Payer: Medicare PPO

## 2018-05-28 DIAGNOSIS — R0609 Other forms of dyspnea: Secondary | ICD-10-CM | POA: Diagnosis not present

## 2018-05-28 DIAGNOSIS — R079 Chest pain, unspecified: Secondary | ICD-10-CM

## 2018-05-28 NOTE — Progress Notes (Signed)
Complete echocardiogram has been performed.  Jimmy Bella Brummet RDCS, RVT 

## 2018-07-23 ENCOUNTER — Ambulatory Visit: Payer: Medicare PPO | Admitting: Cardiology

## 2018-07-27 ENCOUNTER — Ambulatory Visit: Payer: Medicare PPO | Admitting: Cardiology

## 2018-08-10 DIAGNOSIS — Z0289 Encounter for other administrative examinations: Secondary | ICD-10-CM | POA: Insufficient documentation

## 2018-08-10 HISTORY — DX: Encounter for other administrative examinations: Z02.89

## 2018-08-15 ENCOUNTER — Ambulatory Visit (HOSPITAL_BASED_OUTPATIENT_CLINIC_OR_DEPARTMENT_OTHER)
Admission: RE | Admit: 2018-08-15 | Discharge: 2018-08-15 | Disposition: A | Discharge status: Home / Self Care | Payer: Medicare PPO | Source: Ambulatory Visit | Attending: Cardiology | Admitting: Cardiology

## 2018-08-15 ENCOUNTER — Encounter: Payer: Self-pay | Admitting: Cardiology

## 2018-08-15 ENCOUNTER — Ambulatory Visit: Payer: Medicare PPO | Admitting: Cardiology

## 2018-08-15 VITALS — BP 122/76 | HR 45 | Ht 61.0 in | Wt 209.0 lb

## 2018-08-15 DIAGNOSIS — E782 Mixed hyperlipidemia: Secondary | ICD-10-CM

## 2018-08-15 DIAGNOSIS — R0609 Other forms of dyspnea: Secondary | ICD-10-CM

## 2018-08-15 DIAGNOSIS — I209 Angina pectoris, unspecified: Secondary | ICD-10-CM | POA: Diagnosis present

## 2018-08-15 DIAGNOSIS — I10 Essential (primary) hypertension: Secondary | ICD-10-CM | POA: Diagnosis not present

## 2018-08-15 NOTE — Progress Notes (Deleted)
Cardiology Office Note:    Date:  08/15/2018   ID:  Marie Hughes, DOB 10/05/1955, MRN [id number]  PCP:  Claudean Severance, MD  Cardiologist:  Garwin Brothers, MD   Referring MD: Claudean Severance, MD    ASSESSMENT:    1. Essential hypertension   2. Mixed hyperlipidemia   3. DOE (dyspnea on exertion)   4. Angina pectoris (HCC)    PLAN:    In order of problems listed above:  1. ***   Medication Adjustments/Labs and Tests Ordered: Current medicines are reviewed at length with the patient today.  Concerns regarding medicines are outlined above.  No orders of the defined types were placed in this encounter.  No orders of the defined types were placed in this encounter.    No chief complaint on file.    History of Present Illness:    Marie Hughes is a 63 y.o. female ***  Past Medical History:  Diagnosis Date  . Bladder problem   . Depression   . Hypertension   . Muscle pain   . Swelling     Past Surgical History:  Procedure Laterality Date  . ABDOMINAL HYSTERECTOMY    . FOOT SURGERY    . HAND SURGERY     BOTH HANDS  . NECK SURGERIES      Current Medications: Current Meds  Medication Sig  . amitriptyline (ELAVIL) 10 MG tablet Take 10 mg by mouth at bedtime as needed.   Marland Kitchen amLODipine (NORVASC) 10 MG tablet Take 10 mg by mouth daily.  . carisoprodol (SOMA) 350 MG tablet Take 350 mg by mouth as needed.   . carvedilol (COREG) 12.5 MG tablet Take 12.5 mg by mouth 2 (two) times daily with a meal.  . HYDROcodone-acetaminophen (NORCO) 10-325 MG per tablet   . KLOR-CON M20 20 MEQ tablet   . lisinopril-hydrochlorothiazide (PRINZIDE,ZESTORETIC) 20-12.5 MG per tablet Take 1 tablet by mouth daily.  . nitroGLYCERIN (NITROSTAT) 0.3 MG SL tablet Place 1 tablet under the tongue every 5 (five) minutes as needed.   Current Facility-Administered Medications for the 08/15/18 encounter (Office Visit) with Revankar, Aundra Dubin, MD  Medication  . Alcohol (DEHYDRATED ALCOHOL 98%) 98 %  injection 0.5 mL  . triamcinolone acetonide (KENALOG) 10 MG/ML injection 10 mg  . triamcinolone acetonide (KENALOG) 10 MG/ML injection 10 mg     Allergies:   Patient has no known allergies.   Social History   Socioeconomic History  . Marital status: Married    Spouse name: Not on file  . Number of children: Not on file  . Years of education: Not on file  . Highest education level: Not on file  Occupational History  . Not on file  Social Needs  . Financial resource strain: Not on file  . Food insecurity:    Worry: Not on file    Inability: Not on file  . Transportation needs:    Medical: Not on file    Non-medical: Not on file  Tobacco Use  . Smoking status: Never Smoker  . Smokeless tobacco: Never Used  Substance and Sexual Activity  . Alcohol use: No    Alcohol/week: 0.0 standard drinks  . Drug use: No  . Sexual activity: Not on file  Lifestyle  . Physical activity:    Days per week: Not on file    Minutes per session: Not on file  . Stress: Not on file  Relationships  . Social connections:    Talks on phone: Not on file  Gets together: Not on file    Attends religious service: Not on file    Active member of club or organization: Not on file    Attends meetings of clubs or organizations: Not on file    Relationship status: Not on file  Other Topics Concern  . Not on file  Social History Narrative  . Not on file     Family History: The patient's family history is not on file.  ROS:   Please see the history of present illness.    All other systems reviewed and are negative.  EKGs/Labs/Other Studies Reviewed:    The following studies were reviewed today: ***   Recent Labs: No results found for requested labs within last 8760 hours.  Recent Lipid Panel No results found for: CHOL, TRIG, HDL, CHOLHDL, VLDL, LDLCALC, LDLDIRECT  Physical Exam:    VS:  BP 122/76 (BP Location: Right Arm, Patient Position: Sitting, Cuff Size: Normal)   Pulse (!) 45    Ht 5\' 1"  (1.549 m)   Wt 209 lb (94.8 kg)   SpO2 98%   BMI 39.49 kg/m     Wt Readings from Last 3 Encounters:  08/15/18 209 lb (94.8 kg)  05/15/18 209 lb (94.8 kg)  04/16/18 209 lb (94.8 kg)     GEN: Patient is in no acute distress HEENT: Normal NECK: No JVD; No carotid bruits LYMPHATICS: No lymphadenopathy CARDIAC: Hear sounds regular, 2/6 systolic murmur at the apex. RESPIRATORY:  Clear to auscultation without rales, wheezing or rhonchi  ABDOMEN: Soft, non-tender, non-distended MUSCULOSKELETAL:  No edema; No deformity  SKIN: Warm and dry NEUROLOGIC:  Alert and oriented x 3 PSYCHIATRIC:  Normal affect   Signed, Garwin Brothersajan R Revankar, MD  08/15/2018 10:43 AM    [facility]

## 2018-08-15 NOTE — Patient Instructions (Signed)
Medication Instructions:  Your physician recommends that you continue on your current medications as directed. Please refer to the Current Medication list given to you today.  If you need a refill on your cardiac medications before your next appointment, please call your pharmacy.   Lab work: Your physician recommends that you return for lab work today: bmp, cbc   If you have labs (blood work) drawn today and your tests are completely normal, you will receive your results only by: Marland Kitchen MyChart Message (if you have MyChart) OR . A paper copy in the mail If you have any lab test that is abnormal or we need to change your treatment, we will call you to review the results.  Testing/Procedures: A chest x-ray takes a picture of the organs and structures inside the chest, including the heart, lungs, and blood vessels. This test can show several things, including, whether the heart is enlarges; whether fluid is building up in the lungs; and whether pacemaker / defibrillator leads are still in place.     [facility] CARDIOVASCULAR DIVISION [facility] [place] [address], [address] [place] [address] Dept: [phone] Loc: [phone]  Marie Hughes  08/15/2018  You are scheduled for a Cardiac Catheterization on Monday, March 9 with Dr. Lance Muss.  1. Please arrive at the [facility] (Main Entrance A) at [facility]: [address] [place], [place] [address] at 5:30 AM (This time is two hours before your procedure to ensure your preparation). Free valet parking service is available.   Special note: Every effort is made to have your procedure done on time. Please understand that emergencies sometimes delay scheduled procedures.  2. Diet: Do not eat solid foods after midnight.  The patient may have clear liquids until 5am upon the day of the procedure.  3. Labs: You will have lab work drawn today.  4. Medication instructions in  preparation for your procedure:  HOLD: Lisinopril-hydrochlorothiazide the day of the procedure.      On the morning of your procedure, take your Aspirin and any morning medicines NOT listed above.  You may use sips of water.  5. Plan for one night stay--bring personal belongings. 6. Bring a current list of your medications and current insurance cards. 7. You MUST have a responsible person to drive you home. 8. Someone MUST be with you the first 24 hours after you arrive home or your discharge will be delayed. 9. Please wear clothes that are easy to get on and off and wear slip-on shoes.  Thank you for allowing [place] to care for you!   -- [facility]    Follow-Up: At [facility], you and your health needs are our priority.  As part of our continuing mission to provide you with exceptional heart care, we have created designated Provider Care Teams.  These Care Teams include your primary Cardiologist (physician) and Advanced Practice Providers (APPs -  Physician Assistants and Nurse Practitioners) who all work together to provide you with the care you need, when you need it. You will need a follow up appointment in 1 months.  Please call our office 2 months in advance to schedule this appointment.  You may see No primary care provider on file. or another member of our [employer] in [place]: Gypsy Balsam, MD . Norman Herrlich, MD  Any Other Special Instructions Will Be Listed Below (If Applicable).   Chest X-Ray  A  chest X-ray is a painless test that uses radiation to create images of the structures inside of your chest. Chest X-rays are used to look for many health conditions, including heart failure, pneumonia, tuberculosis, rib fractures, breathing disorders, and cancer. They may be used to diagnose chest pain, constant coughing, or trouble breathing. Tell a health care provider about:  Any allergies you have.  All medicines you  are taking, including vitamins, herbs, eye drops, creams, and over-the-counter medicines.  Any surgeries you have had.  Any medical conditions you have.  Whether you are pregnant or may be pregnant. What are the risks? Getting a chest X-ray is a safe procedure. However, you will be exposed to a small amount of radiation. Being exposed to too much radiation over a lifetime can increase the risk of cancer. This risk is small, but it may occur if you have many X-rays throughout your life. What happens before the procedure?  You may be asked to remove glasses, jewelry, and any other metal objects.  You will be asked to undress from the waist up. You may be given a hospital gown to wear.  You may be asked to wear a protective lead apron to protect parts of your body from radiation. What happens during the procedure?  You will be asked to stand still as each picture is taken to get the best possible images.  You will be asked to take a deep breath and hold your breath for a few seconds.  The X-ray machine will create a picture of your chest using a tiny burst of radiation. This is painless.  More pictures may be taken from other angles. Typically, one picture will be taken while you face the X-ray camera, and another picture will be taken from the side while you stand. If you cannot stand, you may be asked to lie down. The procedure may vary among health care providers and hospitals. What happens after the procedure?  The X-ray(s) will be reviewed by your health care provider or an X-ray (radiology) specialist.  It is up to you to get your test results. Ask your health care provider, or the department that is doing the test, when your results will be ready.  Your health care provider will tell you if you need more tests or a follow-up exam. Keep all follow-up visits as told by your health care provider. This is important. Summary  A chest X-ray is a safe, painless test that is used to  examine the inside of the chest, heart, and lungs.  You will need to undress from the waist up and remove jewelry and metal objects before the procedure.  You will be exposed to a small amount of radiation during the procedure.  The X-ray machine will take one or more pictures of your chest while you remain as still as possible.  Later, a health care provider or specialist will review the test results with you. This information is not intended to replace advice given to you by your health care provider. Make sure you discuss any questions you have with your health care provider. Document Released: 07/26/2016 Document Revised: 07/26/2016 Document Reviewed: 07/26/2016 Elsevier Interactive Patient Education  2019 [employer]   Coronary Angiogram With Stent Coronary angiogram with stent placement is a procedure to widen or open a narrow blood vessel of the heart (coronary artery). Arteries may become blocked by cholesterol buildup (plaques) in the lining of the wall. When a coronary artery becomes partially blocked, blood flow to  that area decreases. This may lead to chest pain or a heart attack (myocardial infarction). A stent is a small piece of metal that looks like mesh or a spring. Stent placement may be done as treatment for a heart attack or right after a coronary angiogram in which a blocked artery is found. Let your health care provider know about:  Any allergies you have.  All medicines you are taking, including vitamins, herbs, eye drops, creams, and over-the-counter medicines.  Any problems you or family members have had with anesthetic medicines.  Any blood disorders you have.  Any surgeries you have had.  Any medical conditions you have.  Whether you are pregnant or may be pregnant. What are the risks? Generally, this is a safe procedure. However, problems may occur, including:  Damage to the heart or its blood vessels.  A return of blockage.  Bleeding, infection,  or bruising at the insertion site.  A collection of blood under the skin (hematoma) at the insertion site.  A blood clot in another part of the body.  Kidney injury.  Allergic reaction to the dye or contrast that is used.  Bleeding into the abdomen (retroperitoneal bleeding). What happens before the procedure? Staying hydrated Follow instructions from your health care provider about hydration, which may include:  Up to 2 hours before the procedure - you may continue to drink clear liquids, such as water, clear fruit juice, black coffee, and plain tea.  Eating and drinking restrictions Follow instructions from your health care provider about eating and drinking, which may include:  8 hours before the procedure - stop eating heavy meals or foods such as meat, fried foods, or fatty foods.  6 hours before the procedure - stop eating light meals or foods, such as toast or cereal.  2 hours before the procedure - stop drinking clear liquids. Ask your health care provider about:  Changing or stopping your regular medicines. This is especially important if you are taking diabetes medicines or blood thinners.  Taking medicines such as ibuprofen. These medicines can thin your blood. Do not take these medicines before your procedure if your health care provider instructs you not to. Generally, aspirin is recommended before a procedure of passing a small, thin tube (catheter) through a blood vessel and into the heart (cardiac catheterization). What happens during the procedure?   An IV tube will be inserted into one of your veins.  You will be given one or more of the following: ? A medicine to help you relax (sedative). ? A medicine to numb the area where the catheter will be inserted into an artery (local anesthetic).  To reduce your risk of infection: ? Your health care team will wash or sanitize their hands. ? Your skin will be washed with soap. ? Hair may be removed from the area  where the catheter will be inserted.  Using a guide wire, the catheter will be inserted into an artery. The location may be in your groin, in your wrist, or in the fold of your arm (near your elbow).  A type of X-ray (fluoroscopy) will be used to help guide the catheter to the opening of the arteries in the heart.  A dye will be injected into the catheter, and X-rays will be taken. The dye will help to show where any narrowing or blockages are located in the arteries.  A tiny wire will be guided to the blocked spot, and a balloon will be inflated to make the artery  wider.  The stent will be expanded and will crush the plaques into the wall of the vessel. The stent will hold the area open and improve the blood flow. Most stents have a drug coating to reduce the risk of the stent narrowing over time.  The artery may be made wider using a drill, laser, or other tools to remove plaques.  When the blood flow is better, the catheter will be removed. The lining of the artery will grow over the stent, which stays where it was placed. This procedure may vary among health care providers and hospitals. What happens after the procedure?  If the procedure is done through the leg, you will be kept in bed lying flat for about 6 hours. You will be instructed to not bend and not cross your legs.  The insertion site will be checked frequently.  The pulse in your foot or wrist will be checked frequently.  You may have additional blood tests, X-rays, and a test that records the electrical activity of your heart (electrocardiogram, or ECG). This information is not intended to replace advice given to you by your health care provider. Make sure you discuss any questions you have with your health care provider. Document Released: 12/04/2002 Document Revised: 09/08/2017 Document Reviewed: 01/03/2016 Elsevier Interactive Patient Education  2019 [employer].

## 2018-08-15 NOTE — Progress Notes (Signed)
Cardiology Office Note:    Date:  08/15/2018   ID:  Aradia Tormey, DOB 08/24/1955, MRN [id number]  PCP:  Claudean Severance, MD  Cardiologist:  Garwin Brothers, MD   Referring MD: Claudean Severance, MD    ASSESSMENT:    1. Angina pectoris (HCC)   2. Essential hypertension   3. Mixed hyperlipidemia   4. DOE (dyspnea on exertion)    PLAN:    In order of problems listed above:  1. Primary prevention stressed with the patient.  Importance of compliance with diet and medication stressed and she vocalized understanding.  Her blood pressure is stable.  Diet was discussed for dyslipidemia and obesity and risks of obesity explained and she vocalized understanding. 2. Patient symptoms are of significant concern.  They are very suggestive of anginal equivalent.  In view of this and the fact that she has had a recent stress test which was unremarkable I discussed the following with her. 3. I discussed coronary angiography and left heart catheterization with the patient at extensive length. Procedure, benefits and potential risks were explained. Patient had multiple questions which were answered to the patient's satisfaction. Patient agreed and consented for the procedure. Further recommendations will be made based on the findings of the coronary angiography. In the interim. The patient has any significant symptoms he knows to go to the nearest emergency room. 4. Sublingual nitroglycerin prescription was sent, its protocol and 911 protocol explained and the patient vocalized understanding questions were answered to the patient's satisfaction   Medication Adjustments/Labs and Tests Ordered: Current medicines are reviewed at length with the patient today.  Concerns regarding medicines are outlined above.  No orders of the defined types were placed in this encounter.  No orders of the defined types were placed in this encounter.    No chief complaint on file.    History of Present Illness:     Marie Hughes is a 63 y.o. female.  She has past medical history of essential hypertension and obesity.  She was evaluated for shortness of breath on exertion.  Her echocardiogram was largely unremarkable and stress test also did not reveal any evidence of ischemia.  The patient is on beta-blocker.  She mentions to me that she has shortness of breath on exertion which limits her activity.  This is been of significant concern to her.  When she exerts and feels short of breath she is used a nitroglycerin with relief of her symptoms.  The other issue with her is that she is on a beta-blocker and this causes bradycardia and this concerns her also.  At the time of my evaluation, the patient is alert awake oriented and in no distress.  She is here for this evaluation.  Past Medical History:  Diagnosis Date  . Bladder problem   . Depression   . Hypertension   . Muscle pain   . Swelling     Past Surgical History:  Procedure Laterality Date  . ABDOMINAL HYSTERECTOMY    . FOOT SURGERY    . HAND SURGERY     BOTH HANDS  . NECK SURGERIES      Current Medications: Current Meds  Medication Sig  . amitriptyline (ELAVIL) 10 MG tablet Take 10 mg by mouth at bedtime as needed.   Marland Kitchen amLODipine (NORVASC) 10 MG tablet Take 10 mg by mouth daily.  . carisoprodol (SOMA) 350 MG tablet Take 350 mg by mouth as needed.   . carvedilol (COREG) 12.5 MG tablet Take 12.5 mg  by mouth 2 (two) times daily with a meal.  . HYDROcodone-acetaminophen (NORCO) 10-325 MG per tablet   . KLOR-CON M20 20 MEQ tablet   . lisinopril-hydrochlorothiazide (PRINZIDE,ZESTORETIC) 20-12.5 MG per tablet Take 1 tablet by mouth daily.  . nitroGLYCERIN (NITROSTAT) 0.3 MG SL tablet Place 1 tablet under the tongue every 5 (five) minutes as needed.   Current Facility-Administered Medications for the 08/15/18 encounter (Office Visit) with Mykell Rawl, Aundra Dubin, MD  Medication  . Alcohol (DEHYDRATED ALCOHOL 98%) 98 % injection 0.5 mL  .  triamcinolone acetonide (KENALOG) 10 MG/ML injection 10 mg  . triamcinolone acetonide (KENALOG) 10 MG/ML injection 10 mg     Allergies:   Patient has no known allergies.   Social History   Socioeconomic History  . Marital status: Married    Spouse name: Not on file  . Number of children: Not on file  . Years of education: Not on file  . Highest education level: Not on file  Occupational History  . Not on file  Social Needs  . Financial resource strain: Not on file  . Food insecurity:    Worry: Not on file    Inability: Not on file  . Transportation needs:    Medical: Not on file    Non-medical: Not on file  Tobacco Use  . Smoking status: Never Smoker  . Smokeless tobacco: Never Used  Substance and Sexual Activity  . Alcohol use: No    Alcohol/week: 0.0 standard drinks  . Drug use: No  . Sexual activity: Not on file  Lifestyle  . Physical activity:    Days per week: Not on file    Minutes per session: Not on file  . Stress: Not on file  Relationships  . Social connections:    Talks on phone: Not on file    Gets together: Not on file    Attends religious service: Not on file    Active member of club or organization: Not on file    Attends meetings of clubs or organizations: Not on file    Relationship status: Not on file  Other Topics Concern  . Not on file  Social History Narrative  . Not on file     Family History: The patient's family history is not on file.  ROS:   Please see the history of present illness.    All other systems reviewed and are negative.  EKGs/Labs/Other Studies Reviewed:    The following studies were reviewed today: EKG reveals sinus rhythm and bradycardia and PVC's and nonspecific ST-T changes.   Recent Labs: No results found for requested labs within last 8760 hours.  Recent Lipid Panel No results found for: CHOL, TRIG, HDL, CHOLHDL, VLDL, LDLCALC, LDLDIRECT  Physical Exam:    VS:  BP 122/76 (BP Location: Right Arm, Patient  Position: Sitting, Cuff Size: Normal)   Pulse (!) 45   Ht 5\' 1"  (1.549 m)   Wt 209 lb (94.8 kg)   SpO2 98%   BMI 39.49 kg/m     Wt Readings from Last 3 Encounters:  08/15/18 209 lb (94.8 kg)  05/15/18 209 lb (94.8 kg)  04/16/18 209 lb (94.8 kg)     GEN: Patient is in no acute distress HEENT: Normal NECK: No JVD; No carotid bruits LYMPHATICS: No lymphadenopathy CARDIAC: Hear sounds regular, 2/6 systolic murmur at the apex. RESPIRATORY:  Clear to auscultation without rales, wheezing or rhonchi  ABDOMEN: Soft, non-tender, non-distended MUSCULOSKELETAL:  No edema; No deformity  SKIN: Warm  and dry NEUROLOGIC:  Alert and oriented x 3 PSYCHIATRIC:  Normal affect   Signed, Garwin Brothersajan R Shandon Matson, MD  08/15/2018 10:49 AM    [facility]

## 2018-08-15 NOTE — H&P (View-Only) (Signed)
Cardiology Office Note:    Date:  08/15/2018   ID:  Marie Hughes, DOB 08/24/1955, MRN [id number]  PCP:  Claudean Severance, MD  Cardiologist:  Garwin Brothers, MD   Referring MD: Claudean Severance, MD    ASSESSMENT:    1. Angina pectoris (HCC)   2. Essential hypertension   3. Mixed hyperlipidemia   4. DOE (dyspnea on exertion)    PLAN:    In order of problems listed above:  1. Primary prevention stressed with the patient.  Importance of compliance with diet and medication stressed and she vocalized understanding.  Her blood pressure is stable.  Diet was discussed for dyslipidemia and obesity and risks of obesity explained and she vocalized understanding. 2. Patient symptoms are of significant concern.  They are very suggestive of anginal equivalent.  In view of this and the fact that she has had a recent stress test which was unremarkable I discussed the following with her. 3. I discussed coronary angiography and left heart catheterization with the patient at extensive length. Procedure, benefits and potential risks were explained. Patient had multiple questions which were answered to the patient's satisfaction. Patient agreed and consented for the procedure. Further recommendations will be made based on the findings of the coronary angiography. In the interim. The patient has any significant symptoms he knows to go to the nearest emergency room. 4. Sublingual nitroglycerin prescription was sent, its protocol and 911 protocol explained and the patient vocalized understanding questions were answered to the patient's satisfaction   Medication Adjustments/Labs and Tests Ordered: Current medicines are reviewed at length with the patient today.  Concerns regarding medicines are outlined above.  No orders of the defined types were placed in this encounter.  No orders of the defined types were placed in this encounter.    No chief complaint on file.    History of Present Illness:     Marie Hughes is a 63 y.o. female.  She has past medical history of essential hypertension and obesity.  She was evaluated for shortness of breath on exertion.  Her echocardiogram was largely unremarkable and stress test also did not reveal any evidence of ischemia.  The patient is on beta-blocker.  She mentions to me that she has shortness of breath on exertion which limits her activity.  This is been of significant concern to her.  When she exerts and feels short of breath she is used a nitroglycerin with relief of her symptoms.  The other issue with her is that she is on a beta-blocker and this causes bradycardia and this concerns her also.  At the time of my evaluation, the patient is alert awake oriented and in no distress.  She is here for this evaluation.  Past Medical History:  Diagnosis Date  . Bladder problem   . Depression   . Hypertension   . Muscle pain   . Swelling     Past Surgical History:  Procedure Laterality Date  . ABDOMINAL HYSTERECTOMY    . FOOT SURGERY    . HAND SURGERY     BOTH HANDS  . NECK SURGERIES      Current Medications: Current Meds  Medication Sig  . amitriptyline (ELAVIL) 10 MG tablet Take 10 mg by mouth at bedtime as needed.   Marland Kitchen amLODipine (NORVASC) 10 MG tablet Take 10 mg by mouth daily.  . carisoprodol (SOMA) 350 MG tablet Take 350 mg by mouth as needed.   . carvedilol (COREG) 12.5 MG tablet Take 12.5 mg  by mouth 2 (two) times daily with a meal.  . HYDROcodone-acetaminophen (NORCO) 10-325 MG per tablet   . KLOR-CON M20 20 MEQ tablet   . lisinopril-hydrochlorothiazide (PRINZIDE,ZESTORETIC) 20-12.5 MG per tablet Take 1 tablet by mouth daily.  . nitroGLYCERIN (NITROSTAT) 0.3 MG SL tablet Place 1 tablet under the tongue every 5 (five) minutes as needed.   Current Facility-Administered Medications for the 08/15/18 encounter (Office Visit) with Dodie Parisi, Aundra Dubin, MD  Medication  . Alcohol (DEHYDRATED ALCOHOL 98%) 98 % injection 0.5 mL  .  triamcinolone acetonide (KENALOG) 10 MG/ML injection 10 mg  . triamcinolone acetonide (KENALOG) 10 MG/ML injection 10 mg     Allergies:   Patient has no known allergies.   Social History   Socioeconomic History  . Marital status: Married    Spouse name: Not on file  . Number of children: Not on file  . Years of education: Not on file  . Highest education level: Not on file  Occupational History  . Not on file  Social Needs  . Financial resource strain: Not on file  . Food insecurity:    Worry: Not on file    Inability: Not on file  . Transportation needs:    Medical: Not on file    Non-medical: Not on file  Tobacco Use  . Smoking status: Never Smoker  . Smokeless tobacco: Never Used  Substance and Sexual Activity  . Alcohol use: No    Alcohol/week: 0.0 standard drinks  . Drug use: No  . Sexual activity: Not on file  Lifestyle  . Physical activity:    Days per week: Not on file    Minutes per session: Not on file  . Stress: Not on file  Relationships  . Social connections:    Talks on phone: Not on file    Gets together: Not on file    Attends religious service: Not on file    Active member of club or organization: Not on file    Attends meetings of clubs or organizations: Not on file    Relationship status: Not on file  Other Topics Concern  . Not on file  Social History Narrative  . Not on file     Family History: The patient's family history is not on file.  ROS:   Please see the history of present illness.    All other systems reviewed and are negative.  EKGs/Labs/Other Studies Reviewed:    The following studies were reviewed today: EKG reveals sinus rhythm and bradycardia and PVC's and nonspecific ST-T changes.   Recent Labs: No results found for requested labs within last 8760 hours.  Recent Lipid Panel No results found for: CHOL, TRIG, HDL, CHOLHDL, VLDL, LDLCALC, LDLDIRECT  Physical Exam:    VS:  BP 122/76 (BP Location: Right Arm, Patient  Position: Sitting, Cuff Size: Normal)   Pulse (!) 45   Ht 5\' 1"  (1.549 m)   Wt 209 lb (94.8 kg)   SpO2 98%   BMI 39.49 kg/m     Wt Readings from Last 3 Encounters:  08/15/18 209 lb (94.8 kg)  05/15/18 209 lb (94.8 kg)  04/16/18 209 lb (94.8 kg)     GEN: Patient is in no acute distress HEENT: Normal NECK: No JVD; No carotid bruits LYMPHATICS: No lymphadenopathy CARDIAC: Hear sounds regular, 2/6 systolic murmur at the apex. RESPIRATORY:  Clear to auscultation without rales, wheezing or rhonchi  ABDOMEN: Soft, non-tender, non-distended MUSCULOSKELETAL:  No edema; No deformity  SKIN: Warm  and dry NEUROLOGIC:  Alert and oriented x 3 PSYCHIATRIC:  Normal affect   Signed, Racine Erby R Eugene Zeiders, MD  08/15/2018 10:49 AM    [facility]  

## 2018-08-16 ENCOUNTER — Telehealth: Payer: Self-pay

## 2018-08-16 ENCOUNTER — Telehealth: Payer: Self-pay | Admitting: *Deleted

## 2018-08-16 LAB — BASIC METABOLIC PANEL
BUN/Creatinine Ratio: 20 (ref 12–28)
BUN: 14 mg/dL (ref 8–27)
CO2: 24 mmol/L (ref 20–29)
Calcium: 9.5 mg/dL (ref 8.7–10.3)
Chloride: 104 mmol/L (ref 96–106)
Creatinine, Ser: 0.69 mg/dL (ref 0.57–1.00)
GFR calc Af Amer: 108 mL/min/{1.73_m2} (ref >59)
GFR calc non Af Amer: 94 mL/min/{1.73_m2} (ref >59)
Glucose: 113 mg/dL — ABNORMAL HIGH (ref 65–99)
POTASSIUM: 4.1 mmol/L (ref 3.5–5.2)
Sodium: 144 mmol/L (ref 134–144)

## 2018-08-16 LAB — CBC
Hematocrit: 40.7 % (ref 34.0–46.6)
Hemoglobin: 13.3 g/dL (ref 11.1–15.9)
MCH: 28.8 pg (ref 26.6–33.0)
MCHC: 32.7 g/dL (ref 31.5–35.7)
MCV: 88 fL (ref 79–97)
Platelets: 227 10*3/uL (ref 150–450)
RBC: 4.62 x10E6/uL (ref 3.77–5.28)
RDW: 12.8 % (ref 11.7–15.4)
WBC: 5.2 10*3/uL (ref 3.4–10.8)

## 2018-08-16 NOTE — Telephone Encounter (Signed)
-----   Message from Rajan R Revankar, MD sent at 08/15/2018  4:43 PM EST ----- The results of the study is unremarkable. Please inform patient. I will discuss in detail at next appointment. Cc  primary care/referring physician Rajan R Revankar, MD 08/15/2018 4:43 PM 

## 2018-08-16 NOTE — Telephone Encounter (Signed)
Pt contacted pre-catheterization scheduled at [facility] for: Monday August 20, 2018 7:30 AM Verified arrival time and place: [facility] Main Entrance A at: 5:30 AM  No solid food after midnight prior to cath, clear liquids until 5 AM day of procedure. Contrast allergy: no  Hold: Lisinopril-HCTZ-AM of procedure. KCl-AM of procedure.  Except hold medications AM meds can be  taken pre-cath with sip of water including: ASA 81 mg  Confirmed patient has responsible person to drive home post procedure and observe 24 hours after arriving home: yes  Pt states she has non productive cough today, no fever. Pt advised to monitor symptoms-if she develops fever, feels worse, pt advised to call our office to reschedule. I did give her phone number to [facility] if outside of our office hours (over the week end or before office hours Monday morning) to call to cancel. I asked her to notify our office if she does cancel so we can reschedule cath. Pt verbalized understanding, thanked me.

## 2018-08-16 NOTE — Telephone Encounter (Signed)
Patient called and notified of test results. 

## 2018-08-20 ENCOUNTER — Encounter (HOSPITAL_COMMUNITY)
Admission: RE | Disposition: A | Discharge status: Home / Self Care | Payer: Self-pay | Source: Home / Self Care | Attending: Interventional Cardiology

## 2018-08-20 ENCOUNTER — Ambulatory Visit (HOSPITAL_COMMUNITY)
Admission: RE | Admit: 2018-08-20 | Discharge: 2018-08-20 | Disposition: A | Discharge status: Home / Self Care | Payer: Medicare PPO | Attending: Interventional Cardiology | Admitting: Interventional Cardiology

## 2018-08-20 ENCOUNTER — Other Ambulatory Visit: Payer: Self-pay

## 2018-08-20 ENCOUNTER — Encounter (HOSPITAL_COMMUNITY): Payer: Self-pay | Admitting: Interventional Cardiology

## 2018-08-20 DIAGNOSIS — E669 Obesity, unspecified: Secondary | ICD-10-CM | POA: Insufficient documentation

## 2018-08-20 DIAGNOSIS — R0609 Other forms of dyspnea: Secondary | ICD-10-CM | POA: Insufficient documentation

## 2018-08-20 DIAGNOSIS — I25119 Atherosclerotic heart disease of native coronary artery with unspecified angina pectoris: Secondary | ICD-10-CM | POA: Diagnosis present

## 2018-08-20 DIAGNOSIS — Z79899 Other long term (current) drug therapy: Secondary | ICD-10-CM | POA: Diagnosis not present

## 2018-08-20 DIAGNOSIS — F329 Major depressive disorder, single episode, unspecified: Secondary | ICD-10-CM | POA: Diagnosis not present

## 2018-08-20 DIAGNOSIS — I2510 Atherosclerotic heart disease of native coronary artery without angina pectoris: Secondary | ICD-10-CM | POA: Diagnosis not present

## 2018-08-20 DIAGNOSIS — I10 Essential (primary) hypertension: Secondary | ICD-10-CM | POA: Insufficient documentation

## 2018-08-20 DIAGNOSIS — E782 Mixed hyperlipidemia: Secondary | ICD-10-CM | POA: Insufficient documentation

## 2018-08-20 DIAGNOSIS — I209 Angina pectoris, unspecified: Secondary | ICD-10-CM

## 2018-08-20 DIAGNOSIS — Z6839 Body mass index (BMI) 39.0-39.9, adult: Secondary | ICD-10-CM | POA: Insufficient documentation

## 2018-08-20 HISTORY — PX: LEFT HEART CATH AND CORONARY ANGIOGRAPHY: CATH118249

## 2018-08-20 SURGERY — LEFT HEART CATH AND CORONARY ANGIOGRAPHY
Anesthesia: LOCAL

## 2018-08-20 MED ORDER — SODIUM CHLORIDE 0.9% FLUSH: 3.0000 mL | Freq: Two times a day (BID) | INTRAVENOUS | Status: DC

## 2018-08-20 MED ORDER — SODIUM CHLORIDE 0.9 % IV SOLN: 250.0000 mL | INTRAVENOUS | Status: DC | PRN

## 2018-08-20 MED ORDER — HEPARIN SODIUM (PORCINE) 1000 UNIT/ML IJ SOLN
INTRAMUSCULAR | Status: AC
  Filled 2018-08-20: qty 1

## 2018-08-20 MED ORDER — IOHEXOL 350 MG/ML SOLN
INTRAVENOUS | Status: DC | PRN
  Administered 2018-08-20: 65 mL via INTRAVENOUS

## 2018-08-20 MED ORDER — LIDOCAINE HCL (PF) 1 % IJ SOLN
INTRAMUSCULAR | Status: AC
  Filled 2018-08-20: qty 30

## 2018-08-20 MED ORDER — ASPIRIN 81 MG PO CHEW: 81.0000 mg | CHEWABLE_TABLET | ORAL | Status: DC

## 2018-08-20 MED ORDER — VERAPAMIL HCL 2.5 MG/ML IV SOLN
INTRAVENOUS | Status: AC
  Filled 2018-08-20: qty 2

## 2018-08-20 MED ORDER — HEPARIN SODIUM (PORCINE) 1000 UNIT/ML IJ SOLN
INTRAMUSCULAR | Status: DC | PRN
  Administered 2018-08-20: 5000 [IU] via INTRAVENOUS

## 2018-08-20 MED ORDER — LIDOCAINE HCL (PF) 1 % IJ SOLN
INTRAMUSCULAR | Status: DC | PRN
  Administered 2018-08-20: 3 mL

## 2018-08-20 MED ORDER — SODIUM CHLORIDE 0.9% FLUSH: 3.0000 mL | INTRAVENOUS | Status: DC | PRN

## 2018-08-20 MED ORDER — MIDAZOLAM HCL 2 MG/2ML IJ SOLN
INTRAMUSCULAR | Status: AC
  Filled 2018-08-20: qty 2

## 2018-08-20 MED ORDER — ACETAMINOPHEN 325 MG PO TABS: 650.0000 mg | ORAL_TABLET | ORAL | Status: DC | PRN

## 2018-08-20 MED ORDER — SODIUM CHLORIDE 0.9 % IV SOLN: INTRAVENOUS | Status: AC

## 2018-08-20 MED ORDER — VERAPAMIL HCL 2.5 MG/ML IV SOLN
INTRAVENOUS | Status: DC | PRN
  Administered 2018-08-20: 10 mL via INTRA_ARTERIAL

## 2018-08-20 MED ORDER — HEPARIN (PORCINE) IN NACL 1000-0.9 UT/500ML-% IV SOLN
INTRAVENOUS | Status: DC | PRN
  Administered 2018-08-20 (×3): 500 mL

## 2018-08-20 MED ORDER — HEPARIN (PORCINE) IN NACL 1000-0.9 UT/500ML-% IV SOLN
INTRAVENOUS | Status: AC
  Filled 2018-08-20: qty 500

## 2018-08-20 MED ORDER — SODIUM CHLORIDE 0.9 % WEIGHT BASED INFUSION
3.0000 mL/kg/h | INTRAVENOUS | Status: AC
  Administered 2018-08-20: 3 mL/kg/h via INTRAVENOUS

## 2018-08-20 MED ORDER — FENTANYL CITRATE (PF) 100 MCG/2ML IJ SOLN
INTRAMUSCULAR | Status: AC
  Filled 2018-08-20: qty 2

## 2018-08-20 MED ORDER — MIDAZOLAM HCL 2 MG/2ML IJ SOLN
INTRAMUSCULAR | Status: DC | PRN
  Administered 2018-08-20: 2 mg via INTRAVENOUS

## 2018-08-20 MED ORDER — ONDANSETRON HCL 4 MG/2ML IJ SOLN: 4.0000 mg | Freq: Four times a day (QID) | INTRAMUSCULAR | Status: DC | PRN

## 2018-08-20 MED ORDER — FENTANYL CITRATE (PF) 100 MCG/2ML IJ SOLN
INTRAMUSCULAR | Status: DC | PRN
  Administered 2018-08-20: 50 ug via INTRAVENOUS

## 2018-08-20 MED ORDER — SODIUM CHLORIDE 0.9 % WEIGHT BASED INFUSION: 1.0000 mL/kg/h | INTRAVENOUS | Status: DC

## 2018-08-20 SURGICAL SUPPLY — 10 items
CATH 5FR JL3.5 JR4 ANG PIG MP (CATHETERS) ×2 IMPLANT
DEVICE RAD COMP TR BAND LRG (VASCULAR PRODUCTS) ×2 IMPLANT
GLIDESHEATH SLEND SS 6F .021 (SHEATH) ×2 IMPLANT
GUIDEWIRE INQWIRE 1.5J.035X260 (WIRE) ×1 IMPLANT
INQWIRE 1.5J .035X260CM (WIRE) ×2
KIT HEART LEFT (KITS) ×2 IMPLANT
PACK CARDIAC CATHETERIZATION (CUSTOM PROCEDURE TRAY) ×2 IMPLANT
SHEATH PROBE COVER 6X72 (BAG) ×2 IMPLANT
TRANSDUCER W/STOPCOCK (MISCELLANEOUS) ×2 IMPLANT
TUBING CIL FLEX 10 FLL-RA (TUBING) ×2 IMPLANT

## 2018-08-20 NOTE — Interval H&P Note (Signed)
Cath Lab Visit (complete for each Cath Lab visit)  Clinical Evaluation Leading to the Procedure:   ACS: No.  Non-ACS:    Anginal Classification: CCS III  Anti-ischemic medical therapy: Minimal Therapy (1 class of medications)  Non-Invasive Test Results: No non-invasive testing performed  Prior CABG: No previous CABG      History and Physical Interval Note:  08/20/2018 7:36 AM  Marie Hughes  has presented today for surgery, with the diagnosis of Unstable Angina.  The various methods of treatment have been discussed with the patient and family. After consideration of risks, benefits and other options for treatment, the patient has consented to  Procedure(s): LEFT HEART CATH AND CORONARY ANGIOGRAPHY (N/A) as a surgical intervention.  The patient's history has been reviewed, patient examined, no change in status, stable for surgery.  I have reviewed the patient's chart and labs.  Questions were answered to the patient's satisfaction.     Lance Muss

## 2018-08-20 NOTE — Discharge Instructions (Signed)
Radial Site Care ° °This sheet gives you information about how to care for yourself after your procedure. Your health care provider may also give you more specific instructions. If you have problems or questions, contact your health care provider. °What can I expect after the procedure? °After the procedure, it is common to have: °· Bruising and tenderness at the catheter insertion area. °Follow these instructions at home: °Medicines °· Take over-the-counter and prescription medicines only as told by your health care provider. °Insertion site care °· Follow instructions from your health care provider about how to take care of your insertion site. Make sure you: °? Wash your hands with soap and water before you change your bandage (dressing). If soap and water are not available, use hand sanitizer. °? Change your dressing as told by your health care provider. °? Leave stitches (sutures), skin glue, or adhesive strips in place. These skin closures may need to stay in place for 2 weeks or longer. If adhesive strip edges start to loosen and curl up, you may trim the loose edges. Do not remove adhesive strips completely unless your health care provider tells you to do that. °· Check your insertion site every day for signs of infection. Check for: °? Redness, swelling, or pain. °? Fluid or blood. °? Pus or a bad smell. °? Warmth. °· Do not take baths, swim, or use a hot tub until your health care provider approves. °· You may shower 24-48 hours after the procedure, or as directed by your health care provider. °? Remove the dressing and gently wash the site with plain soap and water. °? Pat the area dry with a clean towel. °? Do not rub the site. That could cause bleeding. °· Do not apply powder or lotion to the site. °Activity ° °· For 24 hours after the procedure, or as directed by your health care provider: °? Do not flex or bend the affected arm. °? Do not push or pull heavy objects with the affected arm. °? Do not  drive yourself home from the hospital or clinic. You may drive 24 hours after the procedure unless your health care provider tells you not to. °? Do not operate machinery or power tools. °· Do not lift anything that is heavier than 10 lb (4.5 kg), or the limit that you are told, until your health care provider says that it is safe. °· Ask your health care provider when it is okay to: °? Return to work or school. °? Resume usual physical activities or sports. °? Resume sexual activity. °General instructions °· If the catheter site starts to bleed, raise your arm and put firm pressure on the site. If the bleeding does not stop, get help right away. This is a medical emergency. °· If you went home on the same day as your procedure, a responsible adult should be with you for the first 24 hours after you arrive home. °· Keep all follow-up visits as told by your health care provider. This is important. °Contact a health care provider if: °· You have a fever. °· You have redness, swelling, or yellow drainage around your insertion site. °Get help right away if: °· You have unusual pain at the radial site. °· The catheter insertion area swells very fast. °· The insertion area is bleeding, and the bleeding does not stop when you hold steady pressure on the area. °· Your arm or hand becomes pale, cool, tingly, or numb. °These symptoms may represent a serious problem   that is an emergency. Do not wait to see if the symptoms will go away. Get medical help right away. Call your local emergency services (911 in the [place] Do not drive yourself to the hospital. °Summary °· After the procedure, it is common to have bruising and tenderness at the site. °· Follow instructions from your health care provider about how to take care of your radial site wound. Check the wound every day for signs of infection. °· Do not lift anything that is heavier than 10 lb (4.5 kg), or the limit that you are told, until your health care provider says  that it is safe. °This information is not intended to replace advice given to you by your health care provider. Make sure you discuss any questions you have with your health care provider. °Document Released: 07/02/2010 Document Revised: 07/05/2017 Document Reviewed: 07/05/2017 °Elsevier Interactive Patient Education © 2019 [employer] ° °

## 2018-08-21 ENCOUNTER — Encounter (HOSPITAL_COMMUNITY): Payer: Self-pay | Admitting: Interventional Cardiology

## 2018-08-29 ENCOUNTER — Ambulatory Visit: Payer: Medicare PPO | Admitting: Cardiology

## 2018-09-10 ENCOUNTER — Telehealth: Payer: Self-pay | Admitting: Cardiology

## 2018-09-10 NOTE — Telephone Encounter (Signed)
Virtual Visit Pre-Appointment Phone Call  Steps For Call:  1. Confirm consent - "In the setting of the current Covid19 crisis, you are scheduled for a (phone or video) visit with your provider on (date) at (time).  Just as we do with many in-office visits, in order for you to participate in this visit, we must obtain consent.  If you'd like, I can send this to your mychart (if signed up) or email for you to review.  Otherwise, I can obtain your verbal consent now.  All virtual visits are billed to your insurance company just like a normal visit would be.  By agreeing to a virtual visit, we'd like you to understand that the technology does not allow for your provider to perform an examination, and thus may limit your provider's ability to fully assess your condition.  Finally, though the technology is pretty good, we cannot assure that it will always work on either your or our end, and in the setting of a video visit, we may have to convert it to a phone-only visit.  In either situation, we cannot ensure that we have a secure connection.  Are you willing to proceed?"  2. Give patient instructions for WebEx download to smartphone as below if video visit  3. Advise patient to be prepared with any vital sign or heart rhythm information, their current medicines, and a piece of paper and pen handy for any instructions they may receive the day of their visit  4. Inform patient they will receive a phone call 15 minutes prior to their appointment time (may be from unknown caller ID) so they should be prepared to answer  5. Confirm that appointment type is correct in Epic appointment notes (video vs telephone)    TELEPHONE CALL NOTE  Marie Hughes has been deemed a candidate for a follow-up tele-health visit to limit community exposure during the Covid-19 pandemic. I spoke with the patient via phone to ensure availability of phone/video source, confirm preferred email & phone number, and discuss  instructions and expectations.  I reminded Marie Hughes to be prepared with any vital sign and/or heart rhythm information that could potentially be obtained via home monitoring, at the time of her visit. I reminded Marie Hughes to expect a phone call at the time of her visit if her visit.  Did the patient verbally acknowledge consent to treatment? YES  Marie Hughes 09/10/2018 3:21 PM   DOWNLOADING THE WEBEX SOFTWARE TO SMARTPHONE  - If Apple, go to [employer] and type in WebEx in the search bar. Download [employer], the blue/green circle. The app is free but as with any other app downloads, their phone may require them to verify saved payment information or Apple password. The patient does NOT have to create an account.  - If Android, ask patient to go to [employer] and type in WebEx in the search bar. Download [employer], the blue/green circle. The app is free but as with any other app downloads, their phone may require them to verify saved payment information or Android password. The patient does NOT have to create an account.   CONSENT FOR TELE-HEALTH VISIT - PLEASE REVIEW  I hereby voluntarily request, consent and authorize [facility] and its employed or contracted physicians, physician assistants, nurse practitioners or other licensed health care professionals (the Practitioner), to provide me with telemedicine health care services (the Services") as deemed necessary by the treating Practitioner. I acknowledge and consent  to receive the Services by the Practitioner via telemedicine. I understand that the telemedicine visit will involve communicating with the Practitioner through live audiovisual communication technology and the disclosure of certain medical information by electronic transmission. I acknowledge that I have been given the opportunity to request an in-person assessment or other available alternative prior to the telemedicine visit and am  voluntarily participating in the telemedicine visit.  I understand that I have the right to withhold or withdraw my consent to the use of telemedicine in the course of my care at any time, without affecting my right to future care or treatment, and that the Practitioner or I may terminate the telemedicine visit at any time. I understand that I have the right to inspect all information obtained and/or recorded in the course of the telemedicine visit and may receive copies of available information for a reasonable fee.  I understand that some of the potential risks of receiving the Services via telemedicine include:   Delay or interruption in medical evaluation due to technological equipment failure or disruption;  Information transmitted may not be sufficient (e.g. poor resolution of images) to allow for appropriate medical decision making by the Practitioner; and/or   In rare instances, security protocols could fail, causing a breach of personal health information.  Furthermore, I acknowledge that it is my responsibility to provide information about my medical history, conditions and care that is complete and accurate to the best of my ability. I acknowledge that Practitioner's advice, recommendations, and/or decision may be based on factors not within their control, such as incomplete or inaccurate data provided by me or distortions of diagnostic images or specimens that may result from electronic transmissions. I understand that the practice of medicine is not an exact science and that Practitioner makes no warranties or guarantees regarding treatment outcomes. I acknowledge that I will receive a copy of this consent concurrently upon execution via email to the email address I last provided but may also request a printed copy by calling the office of [place].    I understand that my insurance will be billed for this visit.   I have read or had this consent read to me.  I understand the  contents of this consent, which adequately explains the benefits and risks of the Services being provided via telemedicine.   I have been provided ample opportunity to ask questions regarding this consent and the Services and have had my questions answered to my satisfaction.  I give my informed consent for the services to be provided through the use of telemedicine in my medical care  By participating in this telemedicine visit I agree to the above.

## 2018-09-12 ENCOUNTER — Encounter: Payer: Self-pay | Admitting: Cardiology

## 2018-09-12 ENCOUNTER — Other Ambulatory Visit: Payer: Self-pay

## 2018-09-12 ENCOUNTER — Telehealth (INDEPENDENT_AMBULATORY_CARE_PROVIDER_SITE_OTHER): Payer: Medicare PPO | Admitting: Cardiology

## 2018-09-12 VITALS — BP 150/88 | HR 60 | Ht 60.0 in | Wt 208.0 lb

## 2018-09-12 DIAGNOSIS — I25110 Atherosclerotic heart disease of native coronary artery with unstable angina pectoris: Secondary | ICD-10-CM

## 2018-09-12 DIAGNOSIS — E782 Mixed hyperlipidemia: Secondary | ICD-10-CM

## 2018-09-12 DIAGNOSIS — I10 Essential (primary) hypertension: Secondary | ICD-10-CM | POA: Diagnosis not present

## 2018-09-12 DIAGNOSIS — E088 Diabetes mellitus due to underlying condition with unspecified complications: Secondary | ICD-10-CM | POA: Diagnosis not present

## 2018-09-12 DIAGNOSIS — Z7982 Long term (current) use of aspirin: Secondary | ICD-10-CM

## 2018-09-12 DIAGNOSIS — I2510 Atherosclerotic heart disease of native coronary artery without angina pectoris: Secondary | ICD-10-CM

## 2018-09-12 HISTORY — DX: Atherosclerotic heart disease of native coronary artery with unstable angina pectoris: I25.110

## 2018-09-12 NOTE — Addendum Note (Signed)
Addended by: Pamala Hurry on: 09/12/2018 03:13 PM   Modules accepted: Orders

## 2018-09-12 NOTE — Progress Notes (Signed)
Virtual Visit via Telephone Note    Evaluation Performed:  Follow-up visit  This visit type was conducted due to national recommendations for restrictions regarding the COVID-19 Pandemic (e.g. social distancing).  This format is felt to be most appropriate for this patient at this time.  All issues noted in this document were discussed and addressed.  No physical exam was performed (except for noted visual exam findings with Video Visits).  Please refer to the patient's chart (MyChart message for video visits and phone note for telephone visits) for the patient's consent to telehealth for [facility].  Date:  09/12/2018   ID:  Marie Hughes, DOB [birth year], MRN [id number]  Patient Location:  [place]  Provider location:   [place]  PCP:  Hedda Slade, FNP  Cardiologist:  No primary care provider on file.  Electrophysiologist:  None   Chief Complaint:  CAD  History of Present Illness:    Marie Hughes is a 63 y.o. female who presents via audio/video conferencing for a telehealth visit today.  Patient denies any problems at this time and takes care of activities of daily living.  No chest pain orthopnea or PND.  She is happy to know the results of the coronary angiography which were detailed to her today during this visit also.  At the time of my evaluation, the patient is alert awake oriented and in no distress.  She has lived a sedentary lifestyle but plans to exercise on a regular basis.  I discussed coronary angiography report with her at length.  The patient does not have symptoms concerning for COVID-19 infection (fever, chills, cough, or new shortness of breath).    Prior CV studies:   The following studies were reviewed today: Reading physician: Corky Crafts, MD Ordering physician: Corky Crafts, MD Study date: 08/20/18  Physicians   Panel Physicians Referring Physician Case Authorizing Physician  Corky Crafts, MD (Primary)    Procedures   LEFT  HEART CATH AND CORONARY ANGIOGRAPHY  Conclusion     Prox RCA lesion is 25% stenosed.  Mid Cx lesion is 40% stenosed.  Prox LAD lesion is 25% stenosed.  Ost 1st Diag lesion is 50% stenosed.  LV end diastolic pressure is moderately elevated.  The left ventricular ejection fraction is 50-55% by visual estimate.  The left ventricular systolic function is normal.  There is no aortic valve stenosis.   Nonobstructive CAD.  Moderately increased LVEDP.  Consider diuretic to help with shortness of breath.    Continue with aggressive risk factor modification including weight loss.       Past Medical History:  Diagnosis Date  . Bladder problem   . Depression   . Hypertension   . Muscle pain   . Swelling    Past Surgical History:  Procedure Laterality Date  . ABDOMINAL HYSTERECTOMY    . FOOT SURGERY    . HAND SURGERY     BOTH HANDS  . LEFT HEART CATH AND CORONARY ANGIOGRAPHY N/A 08/20/2018   Procedure: LEFT HEART CATH AND CORONARY ANGIOGRAPHY;  Surgeon: Corky Crafts, MD;  Location: [facility] INVASIVE CV LAB;  Service: Cardiovascular;  Laterality: N/A;  . NECK SURGERIES       Current Meds  Medication Sig  . amitriptyline (ELAVIL) 10 MG tablet Take 10 mg by mouth at bedtime as needed (nerve sensation in legs.).   Marland Kitchen amLODipine (NORVASC) 10 MG tablet Take 10 mg by mouth every evening.   . carisoprodol (SOMA) 350 MG tablet Take 350  mg by mouth daily as needed for muscle spasms.   . carvedilol (COREG) 12.5 MG tablet Take 12.5 mg by mouth daily.   Marland Kitchen HYDROcodone-acetaminophen (NORCO) 10-325 MG per tablet Take 1 tablet by mouth every 6 (six) hours as needed (pain.).   Marland Kitchen KLOR-CON M20 20 MEQ tablet Take 20 mEq by mouth daily at 3 pm.   . lisinopril-hydrochlorothiazide (PRINZIDE,ZESTORETIC) 20-12.5 MG per tablet Take 1 tablet by mouth daily.  . Multiple Vitamins-Minerals (ADULT GUMMY PO) Take 1 tablet by mouth 2 (two) times a week.  . nitroGLYCERIN (NITROSTAT) 0.3 MG SL tablet  Place 0.3 mg under the tongue every 5 (five) minutes x 3 doses as needed for chest pain.   . rosuvastatin (CRESTOR) 20 MG tablet Take 20 mg by mouth daily at 3 pm.    Current Facility-Administered Medications for the 09/12/18 encounter (Telemedicine) with Tailor Westfall, Aundra Dubin, MD  Medication  . Alcohol (DEHYDRATED ALCOHOL 98%) 98 % injection 0.5 mL  . triamcinolone acetonide (KENALOG) 10 MG/ML injection 10 mg  . triamcinolone acetonide (KENALOG) 10 MG/ML injection 10 mg     Allergies:   Patient has no known allergies.   Social History   Tobacco Use  . Smoking status: Never Smoker  . Smokeless tobacco: Never Used  Substance Use Topics  . Alcohol use: No    Alcohol/week: 0.0 standard drinks  . Drug use: No     Family Hx: The patient's family history is not on file.  ROS:   Please see the history of present illness.     All other systems reviewed and are negative.   Labs/Other Tests and Data Reviewed:    Recent Labs: 08/15/2018: BUN 14; Creatinine, Ser 0.69; Hemoglobin 13.3; Platelets 227; Potassium 4.1; Sodium 144   Recent Lipid Panel No results found for: CHOL, TRIG, HDL, CHOLHDL, LDLCALC, LDLDIRECT  Wt Readings from Last 3 Encounters:  09/12/18 208 lb (94.3 kg)  08/20/18 208 lb (94.3 kg)  08/15/18 209 lb (94.8 kg)     Objective:    Vital Signs:  BP (!) 150/88 (BP Location: Left Arm, Patient Position: Sitting, Cuff Size: Normal)   Pulse 60   Ht 5' (1.524 m)   Wt 208 lb (94.3 kg)   BMI 40.62 kg/m    Well nourished, well developed female in no acute distress.   ASSESSMENT & PLAN:    1.  Coronary artery disease: I discussed my findings with the patient at extensive length.  The above-mentioned report was discussed with the patient and secondary importance of compliance with diet and stressed she vocalized understanding. 2.  Her blood pressure is stable.  Diet was discussed for dyslipidemia and diabetes mellitus.  She tells me that she has diet-controlled diabetes  mellitus.  I have initiated her on 81 mg of coated aspirin.  She denies any contraindication as I go over her history.  Importance of compliance with diet and medication stressed.  Risks of obesity explained and she vocalized understanding. 3.  Once the viral situation revealing results she will come for fasting blood work including lipids.Patient will be seen in follow-up appointment in 6 months or earlier if the patient has any concerns   Report as detailed above  COVID-19 Education: The signs and symptoms of COVID-19 were discussed with the patient and how to seek care for testing (follow up with PCP or arrange E-visit).  The importance of social distancing was discussed today.  Patient Risk:   After full review of this patient's clinical status,  I feel that they are at least moderate risk at this time.  Time:   Today, I have spent 15 minutes with the patient with telehealth technology discussing above-mentioned issues..     Medication Adjustments/Labs and Tests Ordered: Current medicines are reviewed at length with the patient today.  Concerns regarding medicines are outlined above.  Tests Ordered: No orders of the defined types were placed in this encounter.  Medication Changes: No orders of the defined types were placed in this encounter.   Disposition:  Follow up in 6 month(s)  Signed, Garwin Brothersajan R Cyree Chuong, MD  09/12/2018 10:19 AM    [facility]

## 2018-09-12 NOTE — Patient Instructions (Signed)
Medication Instructions:  Your physician recommends that you continue on your current medications as directed. Please refer to the Current Medication list given to you today.  If you need a refill on your cardiac medications before your next appointment, please call your pharmacy.   Lab work: NONE today. You will need to return in 3 months to have fasting BMP, lipid panel and liver function testing. You do not need an appointment for these labs, walk in anytime between 8-4 except 12-1.   If you have labs (blood work) drawn today and your tests are completely normal, you will receive your results only by: Marland Kitchen MyChart Message (if you have MyChart) OR . A paper copy in the mail If you have any lab test that is abnormal or we need to change your treatment, we will call you to review the results.  Testing/Procedures: NONE  Follow-Up: At [facility], you and your health needs are our priority.  As part of our continuing mission to provide you with exceptional heart care, we have created designated Provider Care Teams.  These Care Teams include your primary Cardiologist (physician) and Advanced Practice Providers (APPs -  Physician Assistants and Nurse Practitioners) who all work together to provide you with the care you need, when you need it. You will need a follow up appointment in 6 months.

## 2018-11-12 DIAGNOSIS — M79604 Pain in right leg: Secondary | ICD-10-CM

## 2018-11-12 DIAGNOSIS — G8929 Other chronic pain: Secondary | ICD-10-CM

## 2018-11-12 DIAGNOSIS — M25511 Pain in right shoulder: Secondary | ICD-10-CM

## 2018-11-12 DIAGNOSIS — M7989 Other specified soft tissue disorders: Secondary | ICD-10-CM

## 2018-11-12 HISTORY — DX: Pain in right shoulder: M25.511

## 2018-11-12 HISTORY — DX: Other chronic pain: G89.29

## 2018-11-12 HISTORY — DX: Pain in right leg: M79.604

## 2018-11-12 HISTORY — DX: Other specified soft tissue disorders: M79.89

## 2019-02-11 DIAGNOSIS — M5414 Radiculopathy, thoracic region: Secondary | ICD-10-CM | POA: Insufficient documentation

## 2019-02-11 HISTORY — DX: Radiculopathy, thoracic region: M54.14

## 2019-02-19 ENCOUNTER — Ambulatory Visit: Payer: Medicare PPO | Admitting: Cardiology

## 2019-02-26 ENCOUNTER — Encounter: Payer: Self-pay | Admitting: Cardiology

## 2019-02-26 ENCOUNTER — Ambulatory Visit (INDEPENDENT_AMBULATORY_CARE_PROVIDER_SITE_OTHER): Payer: Medicare PPO | Admitting: Cardiology

## 2019-02-26 ENCOUNTER — Other Ambulatory Visit: Payer: Self-pay

## 2019-02-26 VITALS — BP 160/82 | HR 99 | Ht 60.0 in | Wt 207.0 lb

## 2019-02-26 DIAGNOSIS — E782 Mixed hyperlipidemia: Secondary | ICD-10-CM

## 2019-02-26 DIAGNOSIS — I10 Essential (primary) hypertension: Secondary | ICD-10-CM

## 2019-02-26 DIAGNOSIS — I2510 Atherosclerotic heart disease of native coronary artery without angina pectoris: Secondary | ICD-10-CM | POA: Diagnosis not present

## 2019-02-26 NOTE — Progress Notes (Signed)
Cardiology Office Note:    Date:  02/26/2019   ID:  Marie Hughes, DOB 06/30/1955, MRN [id number]  PCP:  Marie Slade, FNP  Cardiologist:  Marie Brothers, MD   Referring MD: Marie Slade, FNP    ASSESSMENT:    1. Coronary artery disease involving native coronary artery of native heart without angina pectoris   2. Essential hypertension   3. Mixed hyperlipidemia    PLAN:    In order of problems listed above:  1. Coronary artery disease: Secondary prevention stressed with the patient.  Importance of compliance with diet and medication stressed and she vocalized understanding. 2. Essential hypertension: Blood pressure stable.  Today she came to the office without taking her medications and I emphasized compliance.  Her blood pressure readings at home are fine and she mentioned to me the numbers. 3. Mixed dyslipidemia: Diet was discussed and she promises to comply.  She promises to also diet and lose weight and be active lifestyle. 4. Patient will be seen in follow-up appointment in 6 months or earlier if the patient has any concerns.  She will have fasting blood work today including lipids   Medication Adjustments/Labs and Tests Ordered: Current medicines are reviewed at length with the patient today.  Concerns regarding medicines are outlined above.  No orders of the defined types were placed in this encounter.  No orders of the defined types were placed in this encounter.    Chief Complaint  Patient presents with  . Follow-up     History of Present Illness:    Marie Hughes is a 63 y.o. female.  Patient has nonobstructive coronary artery disease.  She denies any problems at this time and takes care of activities of daily living.  No chest pain orthopnea or PND.  She only experiences chest tightness occasionally.  She leads a sedentary lifestyle.  At the time of my evaluation, the patient is alert awake oriented and in no distress.  Past Medical History:  Diagnosis  Date  . Bladder problem   . Depression   . Hypertension   . Muscle pain   . Swelling     Past Surgical History:  Procedure Laterality Date  . ABDOMINAL HYSTERECTOMY    . FOOT SURGERY    . HAND SURGERY     BOTH HANDS  . LEFT HEART CATH AND CORONARY ANGIOGRAPHY N/A 08/20/2018   Procedure: LEFT HEART CATH AND CORONARY ANGIOGRAPHY;  Surgeon: Corky Crafts, MD;  Location: [facility] INVASIVE CV LAB;  Service: Cardiovascular;  Laterality: N/A;  . NECK SURGERIES      Current Medications: Current Meds  Medication Sig  . amitriptyline (ELAVIL) 10 MG tablet Take 10 mg by mouth at bedtime as needed (nerve sensation in legs.).   Marland Kitchen amLODipine (NORVASC) 10 MG tablet Take 10 mg by mouth every evening.   Marland Kitchen aspirin EC 81 MG tablet Take 81 mg by mouth daily.  . carisoprodol (SOMA) 350 MG tablet Take 350 mg by mouth daily as needed for muscle spasms.   . carvedilol (COREG) 12.5 MG tablet Take 12.5 mg by mouth daily.   Marland Kitchen HYDROcodone-acetaminophen (NORCO) 10-325 MG per tablet Take 1 tablet by mouth every 6 (six) hours as needed (pain.).   Marland Kitchen KLOR-CON M20 20 MEQ tablet Take 20 mEq by mouth daily at 3 pm.   . lisinopril-hydrochlorothiazide (PRINZIDE,ZESTORETIC) 20-12.5 MG per tablet Take 1 tablet by mouth daily.  . Multiple Vitamins-Minerals (ADULT GUMMY PO) Take 1 tablet by mouth 2 (two) times a  week.  . nitroGLYCERIN (NITROSTAT) 0.3 MG SL tablet Place 0.3 mg under the tongue every 5 (five) minutes x 3 doses as needed for chest pain.   . rosuvastatin (CRESTOR) 20 MG tablet Take 20 mg by mouth daily at 3 pm.    Current Facility-Administered Medications for the 02/26/19 encounter (Office Visit) with Alain Deschene, Aundra Dubin, MD  Medication  . Alcohol (DEHYDRATED ALCOHOL 98%) 98 % injection 0.5 mL  . triamcinolone acetonide (KENALOG) 10 MG/ML injection 10 mg  . triamcinolone acetonide (KENALOG) 10 MG/ML injection 10 mg     Allergies:   Patient has no known allergies.   Social History   Socioeconomic History   . Marital status: Married    Spouse name: Not on file  . Number of children: Not on file  . Years of education: Not on file  . Highest education level: Not on file  Occupational History  . Not on file  Social Needs  . Financial resource strain: Not on file  . Food insecurity    Worry: Not on file    Inability: Not on file  . Transportation needs    Medical: Not on file    Non-medical: Not on file  Tobacco Use  . Smoking status: Former Smoker    Types: Cigarettes    Quit date: 2008    Years since quitting: 12.7  . Smokeless tobacco: Never Used  Substance and Sexual Activity  . Alcohol use: No    Alcohol/week: 0.0 standard drinks  . Drug use: No  . Sexual activity: Not on file  Lifestyle  . Physical activity    Days per week: Not on file    Minutes per session: Not on file  . Stress: Not on file  Relationships  . Social Musician on phone: Not on file    Gets together: Not on file    Attends religious service: Not on file    Active member of club or organization: Not on file    Attends meetings of clubs or organizations: Not on file    Relationship status: Not on file  Other Topics Concern  . Not on file  Social History Narrative  . Not on file     Family History: The patient's family history is not on file.  ROS:   Please see the history of present illness.    All other systems reviewed and are negative.  EKGs/Labs/Other Studies Reviewed:    The following studies were reviewed today: I discussed coronary angiography with the patient at length   Recent Labs: 08/15/2018: BUN 14; Creatinine, Ser 0.69; Hemoglobin 13.3; Platelets 227; Potassium 4.1; Sodium 144  Recent Lipid Panel No results found for: CHOL, TRIG, HDL, CHOLHDL, VLDL, LDLCALC, LDLDIRECT  Physical Exam:    VS:  Ht 5' (1.524 m)   Wt 207 lb (93.9 kg)   BMI 40.43 kg/m     Wt Readings from Last 3 Encounters:  02/26/19 207 lb (93.9 kg)  09/12/18 208 lb (94.3 kg)  08/20/18 208 lb  (94.3 kg)     GEN: Patient is in no acute distress HEENT: Normal NECK: No JVD; No carotid bruits LYMPHATICS: No lymphadenopathy CARDIAC: Hear sounds regular, 2/6 systolic murmur at the apex. RESPIRATORY:  Clear to auscultation without rales, wheezing or rhonchi  ABDOMEN: Soft, non-tender, non-distended MUSCULOSKELETAL:  No edema; No deformity  SKIN: Warm and dry NEUROLOGIC:  Alert and oriented x 3 PSYCHIATRIC:  Normal affect   Signed, Marie Brothers, MD  02/26/2019 12:08 PM    [facility]

## 2019-02-26 NOTE — Patient Instructions (Signed)
Medication Instructions:  Your physician recommends that you continue on your current medications as directed. Please refer to the Current Medication list given to you today.  If you need a refill on your cardiac medications before your next appointment, please call your pharmacy.   Lab work: Your physician recommends that you return for lab work in: bmet, tsh, liver function, lipids  If you have labs (blood work) drawn today and your tests are completely normal, you will receive your results only by: Marland Kitchen MyChart Message (if you have MyChart) OR . A paper copy in the mail If you have any lab test that is abnormal or we need to change your treatment, we will call you to review the results.  Testing/Procedures: None  Follow-Up: At [facility], you and your health needs are our priority.  As part of our continuing mission to provide you with exceptional heart care, we have created designated Provider Care Teams.  These Care Teams include your primary Cardiologist (physician) and Advanced Practice Providers (APPs -  Physician Assistants and Nurse Practitioners) who all work together to provide you with the care you need, when you need it. You will need a follow up appointment in 6 months.  Please call our office 2 months in advance to schedule this appointment. Any Other Special Instructions Will Be Listed Below (If Applicable).

## 2019-02-27 ENCOUNTER — Telehealth: Payer: Self-pay

## 2019-02-27 LAB — BASIC METABOLIC PANEL
BUN/Creatinine Ratio: 19 (ref 12–28)
BUN: 13 mg/dL (ref 8–27)
CO2: 23 mmol/L (ref 20–29)
Calcium: 9.4 mg/dL (ref 8.7–10.3)
Chloride: 103 mmol/L (ref 96–106)
Creatinine, Ser: 0.7 mg/dL (ref 0.57–1.00)
GFR calc Af Amer: 107 mL/min/{1.73_m2} (ref >59)
GFR calc non Af Amer: 93 mL/min/{1.73_m2} (ref >59)
Glucose: 92 mg/dL (ref 65–99)
Potassium: 4.1 mmol/L (ref 3.5–5.2)
Sodium: 141 mmol/L (ref 134–144)

## 2019-02-27 LAB — LIPID PANEL
Chol/HDL Ratio: 3.3 ratio (ref 0.0–4.4)
Cholesterol, Total: 153 mg/dL (ref 100–199)
HDL: 47 mg/dL (ref >39)
LDL Chol Calc (NIH): 74 mg/dL (ref 0–99)
Triglycerides: 192 mg/dL — ABNORMAL HIGH (ref 0–149)
VLDL Cholesterol Cal: 32 mg/dL (ref 5–40)

## 2019-02-27 LAB — HEPATIC FUNCTION PANEL
ALT: 24 IU/L (ref 0–32)
AST: 17 IU/L (ref 0–40)
Albumin: 4.3 g/dL (ref 3.8–4.8)
Alkaline Phosphatase: 78 IU/L (ref 39–117)
Bilirubin Total: 0.5 mg/dL (ref 0.0–1.2)
Bilirubin, Direct: 0.11 mg/dL (ref 0.00–0.40)
Total Protein: 6.8 g/dL (ref 6.0–8.5)

## 2019-02-27 LAB — TSH: TSH: 1.27 u[IU]/mL (ref 0.450–4.500)

## 2019-02-27 NOTE — Telephone Encounter (Signed)
Left message that lab results were good, copy sent to Dr. Richardson Landry.

## 2019-02-27 NOTE — Telephone Encounter (Signed)
-----   Message from Rajan R Revankar, MD sent at 02/27/2019 10:41 AM EDT ----- The results of the study is unremarkable. Please inform patient. I will discuss in detail at next appointment. Cc  primary care/referring physician Rajan R Revankar, MD 02/27/2019 10:41 AM 

## 2019-05-17 DIAGNOSIS — M25562 Pain in left knee: Secondary | ICD-10-CM | POA: Insufficient documentation

## 2019-05-17 HISTORY — DX: Pain in left knee: M25.562

## 2019-07-01 DIAGNOSIS — Z20822 Contact with and (suspected) exposure to covid-19: Secondary | ICD-10-CM

## 2019-07-01 HISTORY — DX: Contact with and (suspected) exposure to covid-19: Z20.822

## 2019-07-24 DIAGNOSIS — R300 Dysuria: Secondary | ICD-10-CM | POA: Insufficient documentation

## 2019-07-24 HISTORY — DX: Dysuria: R30.0

## 2019-07-30 DIAGNOSIS — N3000 Acute cystitis without hematuria: Secondary | ICD-10-CM

## 2019-07-30 HISTORY — DX: Acute cystitis without hematuria: N30.00

## 2019-10-17 ENCOUNTER — Encounter: Payer: Self-pay | Admitting: Cardiology

## 2019-10-17 ENCOUNTER — Ambulatory Visit: Payer: Medicare PPO | Admitting: Cardiology

## 2019-10-17 ENCOUNTER — Ambulatory Visit (INDEPENDENT_AMBULATORY_CARE_PROVIDER_SITE_OTHER): Payer: Medicare PPO

## 2019-10-17 ENCOUNTER — Other Ambulatory Visit: Payer: Self-pay

## 2019-10-17 VITALS — BP 128/80 | HR 56 | Ht 60.0 in | Wt 206.0 lb

## 2019-10-17 DIAGNOSIS — E782 Mixed hyperlipidemia: Secondary | ICD-10-CM

## 2019-10-17 DIAGNOSIS — R55 Syncope and collapse: Secondary | ICD-10-CM

## 2019-10-17 DIAGNOSIS — I10 Essential (primary) hypertension: Secondary | ICD-10-CM

## 2019-10-17 DIAGNOSIS — I2510 Atherosclerotic heart disease of native coronary artery without angina pectoris: Secondary | ICD-10-CM

## 2019-10-17 DIAGNOSIS — R0989 Other specified symptoms and signs involving the circulatory and respiratory systems: Secondary | ICD-10-CM

## 2019-10-17 DIAGNOSIS — I447 Left bundle-branch block, unspecified: Secondary | ICD-10-CM

## 2019-10-17 HISTORY — DX: Left bundle-branch block, unspecified: I44.7

## 2019-10-17 HISTORY — DX: Other specified symptoms and signs involving the circulatory and respiratory systems: R09.89

## 2019-10-17 NOTE — Addendum Note (Signed)
Addended by: Eleonore Chiquito on: 10/17/2019 03:58 PM   Modules accepted: Orders

## 2019-10-17 NOTE — Progress Notes (Signed)
Cardiology Office Note:    Date:  10/17/2019   ID:  Marie Hughes, DOB Jan 11, 1956, MRN [id number]  PCP:  Hedda Slade, FNP  Cardiologist:  Garwin Brothers, MD   Referring MD: Hedda Slade, FNP    ASSESSMENT:    1. Coronary artery disease involving native coronary artery of native heart without angina pectoris   2. Essential hypertension   3. Mixed hyperlipidemia   4. Morbid obesity (HCC)    PLAN:    In order of problems listed above:  1. Coronary artery disease: Secondary prevention stressed with the patient.  Importance of compliance with diet and medication stressed and she vocalized understanding. 2. Essential hypertension: Blood pressure is stable and diet was emphasized 3. Mixed dyslipidemia and obesity: Diet was emphasized extensively.  I will get her back in the next few days for blood work including fasting lipids.  Risks of obesity explained and she plans to do better. 4. Patient has bilateral carotid bruits and will get carotid Doppler. 5. In view of her symptoms of palpitations and near syncope we will do a 2-week event monitoring.  She knows to go to nearest emergency room for any concerning symptoms. 6. Patient will be seen in follow-up appointment in 6 months or earlier if the patient has any concerns    Medication Adjustments/Labs and Tests Ordered: Current medicines are reviewed at length with the patient today.  Concerns regarding medicines are outlined above.  No orders of the defined types were placed in this encounter.  No orders of the defined types were placed in this encounter.    Chief Complaint  Patient presents with  . Follow-up    6 Months     History of Present Illness:    Marie Hughes is a 64 y.o. female.  Patient has past medical history of essential hypertension, coronary artery disease dyslipidemia and morbid obesity.  She mentions to me that she has no chest pain but she feels a little like palpitations and almost passing out.  No  orthopnea or PND.  She has never passed out.  At the time of my evaluation she is alert awake oriented and in no distress.  She leads a very sedentary lifestyle.  She has not been compliant with exercise either.  At the time of my evaluation, the patient is alert awake oriented and in no distress.  Past Medical History:  Diagnosis Date  . Bladder problem   . Depression   . Hypertension   . Muscle pain   . Swelling     Past Surgical History:  Procedure Laterality Date  . ABDOMINAL HYSTERECTOMY    . FOOT SURGERY    . HAND SURGERY     BOTH HANDS  . LEFT HEART CATH AND CORONARY ANGIOGRAPHY N/A 08/20/2018   Procedure: LEFT HEART CATH AND CORONARY ANGIOGRAPHY;  Surgeon: Corky Crafts, MD;  Location: [facility] INVASIVE CV LAB;  Service: Cardiovascular;  Laterality: N/A;  . NECK SURGERIES      Current Medications: Current Meds  Medication Sig  . amitriptyline (ELAVIL) 10 MG tablet Take 10 mg by mouth at bedtime as needed (nerve sensation in legs.).   Marland Kitchen amLODipine (NORVASC) 10 MG tablet Take 10 mg by mouth every evening.   Marland Kitchen aspirin EC 81 MG tablet Take 81 mg by mouth daily.  . carisoprodol (SOMA) 350 MG tablet Take 350 mg by mouth daily as needed for muscle spasms.   . carvedilol (COREG) 12.5 MG tablet Take 12.5 mg by mouth daily.   Marland Kitchen  HYDROcodone-acetaminophen (NORCO) 10-325 MG per tablet Take 1 tablet by mouth every 6 (six) hours as needed (pain.).   Marland Kitchen KLOR-CON M20 20 MEQ tablet Take 20 mEq by mouth daily at 3 pm.   . lisinopril-hydrochlorothiazide (PRINZIDE,ZESTORETIC) 20-12.5 MG per tablet Take 1 tablet by mouth daily.  . Multiple Vitamins-Minerals (ADULT GUMMY PO) Take 1 tablet by mouth 2 (two) times a week.  . nitroGLYCERIN (NITROSTAT) 0.3 MG SL tablet Place 0.3 mg under the tongue every 5 (five) minutes x 3 doses as needed for chest pain.   . rosuvastatin (CRESTOR) 20 MG tablet Take 20 mg by mouth daily at 3 pm.    Current Facility-Administered Medications for the 10/17/19 encounter  (Office Visit) with Decklyn Hyder, Aundra Dubin, MD  Medication  . Alcohol (DEHYDRATED ALCOHOL 98%) 98 % injection 0.5 mL  . triamcinolone acetonide (KENALOG) 10 MG/ML injection 10 mg  . triamcinolone acetonide (KENALOG) 10 MG/ML injection 10 mg     Allergies:   Patient has no known allergies.   Social History   Socioeconomic History  . Marital status: Widowed    Spouse name: Not on file  . Number of children: Not on file  . Years of education: Not on file  . Highest education level: Not on file  Occupational History  . Not on file  Tobacco Use  . Smoking status: Former Smoker    Types: Cigarettes    Quit date: 2008    Years since quitting: 13.3  . Smokeless tobacco: Never Used  Substance and Sexual Activity  . Alcohol use: No    Alcohol/week: 0.0 standard drinks  . Drug use: No  . Sexual activity: Not on file  Other Topics Concern  . Not on file  Social History Narrative  . Not on file   Social Determinants of Health   Financial Resource Strain:   . Difficulty of Paying Living Expenses:   Food Insecurity:   . Worried About Programme researcher, broadcasting/film/video in the Last Year:   . Barista in the Last Year:   Transportation Needs:   . Freight forwarder (Medical):   Marland Kitchen Lack of Transportation (Non-Medical):   Physical Activity:   . Days of Exercise per Week:   . Minutes of Exercise per Session:   Stress:   . Feeling of Stress :   Social Connections:   . Frequency of Communication with Friends and Family:   . Frequency of Social Gatherings with Friends and Family:   . Attends Religious Services:   . Active Member of Clubs or Organizations:   . Attends Banker Meetings:   Marland Kitchen Marital Status:      Family History: The patient's family history is not on file.  ROS:   Please see the history of present illness.    All other systems reviewed and are negative.  EKGs/Labs/Other Studies Reviewed:    The following studies were reviewed today: I discussed my findings  with the patient at extensive length.  EKG reveals sinus rhythm and left bundle branch block.   Recent Labs: 02/26/2019: ALT 24; BUN 13; Creatinine, Ser 0.70; Potassium 4.1; Sodium 141; TSH 1.270  Recent Lipid Panel    Component Value Date/Time   CHOL 153 02/26/2019 1238   TRIG 192 (H) 02/26/2019 1238   HDL 47 02/26/2019 1238   CHOLHDL 3.3 02/26/2019 1238   LDLCALC 74 02/26/2019 1238    Physical Exam:    VS:  BP 128/80   Pulse (!) 56  Ht 5' (1.524 m)   Wt 206 lb (93.4 kg)   SpO2 97%   BMI 40.23 kg/m     Wt Readings from Last 3 Encounters:  10/17/19 206 lb (93.4 kg)  02/26/19 207 lb (93.9 kg)  09/12/18 208 lb (94.3 kg)     GEN: Patient is in no acute distress HEENT: Normal NECK: No JVD; bilateral soft carotid bruits LYMPHATICS: No lymphadenopathy CARDIAC: Hear sounds regular, 2/6 systolic murmur at the apex. RESPIRATORY:  Clear to auscultation without rales, wheezing or rhonchi  ABDOMEN: Soft, non-tender, non-distended MUSCULOSKELETAL:  No edema; No deformity  SKIN: Warm and dry NEUROLOGIC:  Alert and oriented x 3 PSYCHIATRIC:  Normal affect   Signed, Jenean Lindau, MD  10/17/2019 3:38 PM    [facility]

## 2019-10-17 NOTE — Patient Instructions (Signed)
Medication Instructions:  No medication changes *If you need a refill on your cardiac medications before your next appointment, please call your pharmacy*   Lab Work: Your physician recommends that you return for lab work in: the next few days. You need to have labs done when you are fasting.  You can come Monday through Friday 8:30 am to 12:00 pm and 1:15 to 4:30. You do not need to make an appointment as the order has already been placed. The labs you are going to have done are BMET, CBC, TSH, LFT and Lipids.   If you have labs (blood work) drawn today and your tests are completely normal, you will receive your results only by: Marland Kitchen MyChart Message (if you have MyChart) OR . A paper copy in the mail If you have any lab test that is abnormal or we need to change your treatment, we will call you to review the results.   Testing/Procedures: Your physician has requested that you have a carotid ultrasound. This test is an ultrasound of the carotid arteries in your neck. It looks at blood flow through these arteries that supply the brain with blood. Allow one hour for this exam. There are no restrictions or special instructions.  WHY IS MY DOCTOR PRESCRIBING ZIO? The Zio system is proven and trusted by physicians to detect and diagnose irregular heart rhythms -- and has been prescribed to hundreds of thousands of patients.  The FDA has cleared the Zio system to monitor for many different kinds of irregular heart rhythms. In a study, physicians were able to reach a diagnosis 90% of the time with the Zio system1.  You can wear the Zio monitor -- a small, discreet, comfortable patch -- during your normal day-to-day activity, including while you sleep, shower, and exercise, while it records every single heartbeat for analysis.  1Barrett, P., et al. Comparison of 24 Hour Holter Monitoring Versus 14 Day Novel Adhesive Patch Electrocardiographic Monitoring. American Journal of Medicine, 2014.  ZIO VS.  HOLTER MONITORING The Zio monitor can be comfortably worn for up to 14 days. Holter monitors can be worn for 24 to 48 hours, limiting the time to record any irregular heart rhythms you may have. Zio is able to capture data for the 51% of patients who have their first symptom-triggered arrhythmia after 48 hours.1  LIVE WITHOUT RESTRICTIONS The Zio ambulatory cardiac monitor is a small, unobtrusive, and water-resistant patch--you might even forget you're wearing it. The Zio monitor records and stores every beat of your heart, whether you're sleeping, working out, or showering.  Wear the monitor for 2 weeks, remove 55/20/21.   Follow-Up: At [facility], you and your health needs are our priority.  As part of our continuing mission to provide you with exceptional heart care, we have created designated Provider Care Teams.  These Care Teams include your primary Cardiologist (physician) and Advanced Practice Providers (APPs -  Physician Assistants and Nurse Practitioners) who all work together to provide you with the care you need, when you need it.  We recommend signing up for the patient portal called "MyChart".  Sign up information is provided on this After Visit Summary.  MyChart is used to connect with patients for Virtual Visits (Telemedicine).  Patients are able to view lab/test results, encounter notes, upcoming appointments, etc.  Non-urgent messages can be sent to your provider as well.   To learn more about what you can do with MyChart, go to [email or web address].    Your next appointment:  6 month(s)  The format for your next appointment:   In Person  Provider:   Jyl Heinz, MD   Other Instructions NA

## 2019-10-18 LAB — CBC WITH DIFFERENTIAL/PLATELET
Basophils Absolute: 0 10*3/uL (ref 0.0–0.2)
Basos: 1 %
EOS (ABSOLUTE): 0.2 10*3/uL (ref 0.0–0.4)
Eos: 3 %
Hematocrit: 38.2 % (ref 34.0–46.6)
Hemoglobin: 13.2 g/dL (ref 11.1–15.9)
Immature Grans (Abs): 0 10*3/uL (ref 0.0–0.1)
Immature Granulocytes: 0 %
Lymphocytes Absolute: 1.7 10*3/uL (ref 0.7–3.1)
Lymphs: 33 %
MCH: 30.3 pg (ref 26.6–33.0)
MCHC: 34.6 g/dL (ref 31.5–35.7)
MCV: 88 fL (ref 79–97)
Monocytes Absolute: 0.5 10*3/uL (ref 0.1–0.9)
Monocytes: 10 %
Neutrophils Absolute: 2.8 10*3/uL (ref 1.4–7.0)
Neutrophils: 53 %
Platelets: 238 10*3/uL (ref 150–450)
RBC: 4.35 x10E6/uL (ref 3.77–5.28)
RDW: 12.6 % (ref 11.7–15.4)
WBC: 5.3 10*3/uL (ref 3.4–10.8)

## 2019-10-18 LAB — HEPATIC FUNCTION PANEL
ALT: 21 IU/L (ref 0–32)
AST: 19 IU/L (ref 0–40)
Albumin: 4.2 g/dL (ref 3.8–4.8)
Alkaline Phosphatase: 77 IU/L (ref 39–117)
Bilirubin Total: 0.3 mg/dL (ref 0.0–1.2)
Bilirubin, Direct: 0.11 mg/dL (ref 0.00–0.40)
Total Protein: 6.6 g/dL (ref 6.0–8.5)

## 2019-10-18 LAB — BASIC METABOLIC PANEL
BUN/Creatinine Ratio: 18 (ref 12–28)
BUN: 12 mg/dL (ref 8–27)
CO2: 23 mmol/L (ref 20–29)
Calcium: 9.1 mg/dL (ref 8.7–10.3)
Chloride: 105 mmol/L (ref 96–106)
Creatinine, Ser: 0.67 mg/dL (ref 0.57–1.00)
GFR calc Af Amer: 108 mL/min/{1.73_m2} (ref >59)
GFR calc non Af Amer: 94 mL/min/{1.73_m2} (ref >59)
Glucose: 100 mg/dL — ABNORMAL HIGH (ref 65–99)
Potassium: 3.8 mmol/L (ref 3.5–5.2)
Sodium: 142 mmol/L (ref 134–144)

## 2019-10-18 LAB — TSH: TSH: 1.64 u[IU]/mL (ref 0.450–4.500)

## 2019-10-18 LAB — LIPID PANEL
Chol/HDL Ratio: 3.5 ratio (ref 0.0–4.4)
Cholesterol, Total: 145 mg/dL (ref 100–199)
HDL: 42 mg/dL (ref >39)
LDL Chol Calc (NIH): 75 mg/dL (ref 0–99)
Triglycerides: 161 mg/dL — ABNORMAL HIGH (ref 0–149)
VLDL Cholesterol Cal: 28 mg/dL (ref 5–40)

## 2019-11-07 ENCOUNTER — Ambulatory Visit (INDEPENDENT_AMBULATORY_CARE_PROVIDER_SITE_OTHER): Payer: Medicare PPO

## 2019-11-07 ENCOUNTER — Other Ambulatory Visit: Payer: Self-pay

## 2019-11-07 DIAGNOSIS — R0989 Other specified symptoms and signs involving the circulatory and respiratory systems: Secondary | ICD-10-CM | POA: Diagnosis not present

## 2019-11-07 NOTE — Progress Notes (Signed)
Carotid duplex exam performed  Jimmy Reel RDCS, RVT 

## 2019-12-18 DIAGNOSIS — N3090 Cystitis, unspecified without hematuria: Secondary | ICD-10-CM

## 2019-12-18 DIAGNOSIS — Z1231 Encounter for screening mammogram for malignant neoplasm of breast: Secondary | ICD-10-CM | POA: Insufficient documentation

## 2019-12-18 DIAGNOSIS — Z79899 Other long term (current) drug therapy: Secondary | ICD-10-CM

## 2019-12-18 DIAGNOSIS — H9202 Otalgia, left ear: Secondary | ICD-10-CM | POA: Insufficient documentation

## 2019-12-18 DIAGNOSIS — E559 Vitamin D deficiency, unspecified: Secondary | ICD-10-CM | POA: Insufficient documentation

## 2019-12-18 HISTORY — DX: Other long term (current) drug therapy: Z79.899

## 2019-12-18 HISTORY — DX: Vitamin D deficiency, unspecified: E55.9

## 2019-12-18 HISTORY — DX: Encounter for screening mammogram for malignant neoplasm of breast: Z12.31

## 2019-12-18 HISTORY — DX: Otalgia, left ear: H92.02

## 2019-12-18 HISTORY — DX: Cystitis, unspecified without hematuria: N30.90

## 2020-01-24 DIAGNOSIS — G8929 Other chronic pain: Secondary | ICD-10-CM

## 2020-01-24 HISTORY — DX: Other chronic pain: G89.29

## 2020-04-12 DIAGNOSIS — R0609 Other forms of dyspnea: Secondary | ICD-10-CM

## 2020-04-12 DIAGNOSIS — R0600 Dyspnea, unspecified: Secondary | ICD-10-CM

## 2020-04-12 DIAGNOSIS — R55 Syncope and collapse: Secondary | ICD-10-CM

## 2020-04-12 HISTORY — DX: Other forms of dyspnea: R06.09

## 2020-04-12 HISTORY — DX: Dyspnea, unspecified: R06.00

## 2020-04-12 HISTORY — DX: Syncope and collapse: R55

## 2020-04-17 DIAGNOSIS — Z713 Dietary counseling and surveillance: Secondary | ICD-10-CM

## 2020-04-17 HISTORY — DX: Dietary counseling and surveillance: Z71.3

## 2020-05-05 ENCOUNTER — Other Ambulatory Visit: Payer: Self-pay

## 2020-05-05 DIAGNOSIS — N329 Bladder disorder, unspecified: Secondary | ICD-10-CM | POA: Insufficient documentation

## 2020-05-05 DIAGNOSIS — I10 Essential (primary) hypertension: Secondary | ICD-10-CM | POA: Insufficient documentation

## 2020-05-05 DIAGNOSIS — R609 Edema, unspecified: Secondary | ICD-10-CM | POA: Insufficient documentation

## 2020-05-05 DIAGNOSIS — M791 Myalgia, unspecified site: Secondary | ICD-10-CM | POA: Insufficient documentation

## 2020-05-05 DIAGNOSIS — F32A Depression, unspecified: Secondary | ICD-10-CM | POA: Insufficient documentation

## 2020-05-06 ENCOUNTER — Encounter: Payer: Self-pay | Admitting: Cardiology

## 2020-05-06 ENCOUNTER — Ambulatory Visit: Payer: Medicare PPO | Admitting: Cardiology

## 2020-05-06 ENCOUNTER — Other Ambulatory Visit: Payer: Self-pay

## 2020-05-06 VITALS — BP 158/94 | HR 64 | Ht 60.0 in | Wt 213.8 lb

## 2020-05-06 DIAGNOSIS — I447 Left bundle-branch block, unspecified: Secondary | ICD-10-CM | POA: Diagnosis not present

## 2020-05-06 DIAGNOSIS — I10 Essential (primary) hypertension: Secondary | ICD-10-CM

## 2020-05-06 DIAGNOSIS — E782 Mixed hyperlipidemia: Secondary | ICD-10-CM | POA: Diagnosis not present

## 2020-05-06 DIAGNOSIS — I25110 Atherosclerotic heart disease of native coronary artery with unstable angina pectoris: Secondary | ICD-10-CM | POA: Diagnosis not present

## 2020-05-06 NOTE — Patient Instructions (Signed)

## 2020-05-06 NOTE — Progress Notes (Signed)
Cardiology Office Note:    Date:  05/06/2020   ID:  Marie Hughes, DOB 1955-08-03, MRN [id number]  PCP:  Malva Limes, FNP  Cardiologist:  Garwin Brothers, MD   Referring MD: Hedda Slade, FNP    ASSESSMENT:    1. Essential hypertension   2. Mixed hyperlipidemia   3. Coronary artery disease involving native coronary artery of native heart with unstable angina pectoris (HCC)   4. Left bundle branch block (LBBB)   5. Morbid obesity (HCC)    PLAN:    In order of problems listed above:  1. Coronary artery disease: Secondary prevention stressed with the patient.  Importance of compliance with diet medication stressed and she vocalized understanding.  I advised her to walk on a regular basis. 2. Essential hypertension: Blood pressure is elevated she is worried about high blood pressure.  Lifestyle modification and salt intake issues were urged she was advised to keep a BP log and send it to me in a week I will review it. 3. Mixed dyslipidemia: Diet was emphasized.  Patient's lipids were reviewed and discussed with her. 4. Left bundle branch block: Stable at this time.  Patient does not want any further evaluation.  She has never passed out. 5. Morbid obesity: Diet was emphasized.  Risks of obesity explained and he vocalized understanding.  She promises to do better.  Advised her to see a dietitian and be engaged with her for weight loss. 6. Patient had multiple questions which were answered to her satisfaction and extensive review of records for hospitalization was done.   Medication Adjustments/Labs and Tests Ordered: Current medicines are reviewed at length with the patient today.  Concerns regarding medicines are outlined above.  No orders of the defined types were placed in this encounter.  No orders of the defined types were placed in this encounter.    No chief complaint on file.    History of Present Illness:    Marie Hughes is a 64 y.o. female.  Patient has  nonobstructive coronary artery disease.  She denies any problems at this time and takes care of activities of daily living.  She mentions to me that she had her Covid vaccination and developed some palpitations-like feeling.  Subsequently she has been fine.  She tells me that she almost passed out at church and went to the hospital.  I reviewed those records extensively.  She denies passing out.  She has had an event monitor done in June which was unremarkable.  No chest pain orthopnea or PND.  She has left bundle branch block and a stress test was fine.  At the time of my evaluation, the patient is alert awake oriented and in no distress.  Past Medical History:  Diagnosis Date  . Acute cystitis without hematuria 07/30/2019   Last Assessment & Plan:  Formatting of this note might be different from the original. UA most consistent with uncomplicated UTI. No red flag features to suggest need for imaging, special testing or urgent specialty consultation at this time. Antibiotic started per visit orders. Will send UA for culture to confirm sensitivity to abx given. Recommend f/u with PCP or return to clinic or ED for worse  . Arthralgia of knee, left 05/17/2019   Last Assessment & Plan:  Formatting of this note might be different from the original. Recently provided intra-articular injection is done very well, could repeat in the future if persistent symptoms.  . Bilateral carotid bruits 10/17/2019  . Bladder problem   .  Cervical post-laminectomy syndrome 08/21/2017   Last Assessment & Plan:  Formatting of this note might be different from the original. Multilevel cervical fusion with continued neck and right upper extremity radicular pain.  Stable at this time without any progression, we will hold on any further imaging at this time.  . Chest pain 12/21/2017   Last Assessment & Plan:  Intermittent. Non-radiating. No N/V/D. No vision changes.  Last Assessment & Plan:  Formatting of this note might be different  from the original. No complaints of chest pain since hospital discharge.  . Chronic foot pain, right 01/24/2020   Last Assessment & Plan:  Formatting of this note might be different from the original. 64 year old female with chronic pain of multiple etiologies, at this time most limiting complaint is right foot pain secondary to refractory plantar fasciitis status post surgical intervention.  She is followed by Podiatry.  Today I recommended obtaining a more appropriate supportive footwear, we have also discu  . Chronic right shoulder pain 11/12/2018   Last Assessment & Plan:  Formatting of this note might be different from the original. Today we will take plain films of the shoulder to further evaluate etiology of her pain.  Discussed with patient that it is very likely radicular in nature, as I do not have any findings on exam suspicious of true shoulder etiology.  . Chronically on opiate therapy 12/20/2016   Last Assessment & Plan:  Risks of this type of medication including the risk of addiction and, if misused, of death are reviewed with the patient.  The patient feels the benefits of pain relief and increased ability to engage in self-care activities improve the quality of life to outweigh these risks.  Last Assessment & Plan:  Formatting of this note might be different from the original. Will cont  . Class 2 drug-induced obesity with serious comorbidity and body mass index (BMI) of 39.0 to 39.9 in adult 12/21/2017   Last Assessment & Plan:  Formatting of this note might be different from the original. BMI Assessment: Current Body mass index is 39.91 kg/m.  Patient BMI currently is above average (>25 kg/m2); BMI follow up plan is completed . Current barriers to healthy weight management include none.  BMI Plan:  Today, Vonnetta and I have discussed methods to help address her current weight.   General weight l  . Coronary artery disease involving native coronary artery of native heart with unstable angina  pectoris (HCC) 09/12/2018   Last Assessment & Plan:  Formatting of this note might be different from the original. She is followed by cardiology and has an upcoming appointment with them.   Advised to continue current medications.  . DDD (degenerative disc disease), lumbar 08/21/2017   Last Assessment & Plan:  Stble. No exacerbation of symptoms.  Last Assessment & Plan:  Formatting of this note might be different from the original. Chronic back pain and bilateral lower extremity radicular symptoms that appear to correspond to L4/5 distribution. Review of most recent lumbar MRI from 2013 shows multilevel degenerative changes most prominent at L4-5 where there is mild spinal steno  . Degenerative cervical spinal stenosis 01/05/2016   Last Assessment & Plan:  Formatting of this note might be different from the original. We will continue current medications.  Patient understands she may benefit percutaneous interventions.  Patient has had previous cervical spine surgeries.  . Depression   . Dietary counseling 04/17/2020  . Dyspnea on exertion 04/12/2020  . Dysuria 07/24/2019   Last  Assessment & Plan:  Formatting of this note might be different from the original. Assessment  Pain intermittent in the suprapubic. Mild backpain. Symptoms "come and go", then come back. Hx of kidney stone.  Reports weak urine stream Afebrile, no chills This may be a recurrence of kidney stone UA +wbc 75, will culture  Plan  Will start Flomax and await the urine culture  . Encounter for screening mammogram for breast cancer 12/18/2019  . Essential hypertension 01/05/2016   Last Assessment & Plan:  Formatting of this note might be different from the original. Blood pressure elevated today-follow-up with primary care for further management.  . Exposure to 2019 novel coronavirus 07/01/2019   Last Assessment & Plan:  Formatting of this note might be different from the original. POCT Sars-Cov-2 Sophia NEGATIVE POCT Sars-COV-2 Aptima-COV  I  have explained to the patient they must quarantine according to [facility] current guideline. They are encouraged to protect others in the household by wearing a mask, covering cough, and cleansing any area used by the patient, and if possible quarantine in   . Heart disease 12/21/2017   Last Assessment & Plan:  No reports of chest pain in approximately 8 months. Will refill Nitro SL tabs.  Last Assessment & Plan:  Formatting of this note might be different from the original. Complaining of more frequent episodes of dyspnea on exertion, and bilateral leg swelling which are chronic concerns for her.  Advise she follow-up with her PCP in regards to these concerns for further evaluat  . Hypertension   . Hypertensive urgency 01/05/2016   Last Assessment & Plan:  Formatting of this note might be different from the original. Blood pressure is controlled today. 128/70. Advise to continue monitoring. Notify office for elevation. Continue current medications and f/u with cardiology.  . IFG (impaired fasting glucose) 12/21/2017   Last Assessment & Plan:  Monitoring with blood work. POC according to results.'  Last Assessment & Plan:  Formatting of this note is different from the original. Lab Results  Component Value Date   HGBA1C 6.2 12/21/2017   Prediabetic. Monitoring with BW . Monitoring with blood work . Change in diet including a whole plant based diet, limiting meats especially red meat encouraged.  . Avoiding unhea  . Left bundle branch block (LBBB) 10/17/2019   Formatting of this note might be different from the original. Noted to have LBBB in old records. LBBB mentioned in cardiologist's office note 10/17/19.  Last Assessment & Plan:  Formatting of this note might be different from the original. This is chronic and was noted on recent stress test. She is following with cardiology. Advised to proceed with next appointment.  She is asymptomatic today.  . Long term use of drug 12/18/2019  . Lumbar radiculitis 01/05/2016    Last Assessment & Plan:  Formatting of this note might be different from the original. Patient will continue with current medications.  Patient understands she may benefit percutaneous interventions.  . Mixed hyperlipidemia 01/05/2016   Last Assessment & Plan:  Formatting of this note is different from the original. Problem Complexity:  Chronic problem/illness, stable  Pertinent Data: LDL Calculated (mg/dL)  Date Value  78/29/5621 48   Cholesterol (mg/dl)  Date Value  30/86/5784 135   HDL (mg/dl)  Date Value  69/62/9528 34.4 (L)   Triglycerides (mg/dl)  Date Value  41/32/4401 261 (H)   AST (U/L)  Date Value  12/18/2019 20   ALT (  . Morbid obesity (HCC) 04/16/2018  .  Muscle pain   . Muscle spasm of back 08/21/2017   Last Assessment & Plan:  Formatting of this note might be different from the original. Continue p.r.n. Soma  . Obesity due to excess calories 01/05/2016   Last Assessment & Plan:  Formatting of this note might be different from the original. Diet was discussed and reviewed with the patient. Modify diet to eliminate bad fats (fast food, junk foods) and increase fiber intake. Regular exercise several times per week as appropriate, along with proper diet will help to improve overall health. Discussion concerning the health risk of obesity educated. Edu  . Otalgia, left ear 12/18/2019   Last Assessment & Plan:  Formatting of this note might be different from the original. Physical examination revealed a normal exam.  No evidence of infection.  No evidence of cerumen impaction.  . Other chronic pain 08/21/2017   Last Assessment & Plan:  KIMONI PICKERILL is a 64 y.o.  year old female with Low back pain, Neck pain, due to Multilevel multifactorial degenerative changes in the lumbar and cervical spine, resulting in lower extremity radiculopathy bilaterally, in the setting of previous surgery in the cervical spine. Today I will refill the patient's current medications of Hydrocodone 10/325 mg PO PRN qid, and  S  . Pain and swelling of lower extremity, right 11/12/2018   Last Assessment & Plan:  Formatting of this note might be different from the original. Due to patient's recent onset of worsening lower extremity pain, with increased unilateral swelling and diffuse calf tenderness of the right extremity, we recommend she go for a right lower extremity ultrasound to rule out a DVT.  Marland Kitchen Pain medication agreement 08/10/2018   Last Assessment & Plan:  There is a current opioid agreement on file within the clinic and urine drug screens will be collected as needed to be in compliance with clinic policies.  Last Assessment & Plan:  Formatting of this note might be different from the original. Review of prior UDS results are consistent with regimen.  [place] controlled substance registry reviewed and is consistent wi  . Pre-syncope 04/12/2020   Last Assessment & Plan:  Formatting of this note might be different from the original. No further episodes since hospital discharge.  . Recurrent cystitis 12/18/2019  . Recurrent major depressive disorder, in partial remission (HCC) 01/05/2016   Last Assessment & Plan:  Formatting of this note might be different from the original. This is a chronic problem and is stable.  She denies suicidal or homicidal ideations.  Advised to continue current medications.  . Swelling   . Thoracic radiculopathy 02/11/2019   Last Assessment & Plan:  Formatting of this note might be different from the original. Plain films of the thoracic spine completed after last appointment show ACDF with mid lower cervical spine with thoracic vertebral body alignment and disc spaces normal.  Responded well to previous oral steroids.  Have discussed possibility of C7-T1 CESI should her symptoms return.  . Uncomplicated opioid dependence (HCC) 01/05/2016  . Vitamin D deficiency 12/18/2019    Past Surgical History:  Procedure Laterality Date  . ABDOMINAL HYSTERECTOMY    . FOOT SURGERY    . HAND SURGERY      BOTH HANDS  . LEFT HEART CATH AND CORONARY ANGIOGRAPHY N/A 08/20/2018   Procedure: LEFT HEART CATH AND CORONARY ANGIOGRAPHY;  Surgeon: Corky Crafts, MD;  Location: [facility] INVASIVE CV LAB;  Service: Cardiovascular;  Laterality: N/A;  . NECK SURGERIES  Current Medications: Current Meds  Medication Sig  . amitriptyline (ELAVIL) 10 MG tablet Take 10 mg by mouth at bedtime.  Marland Kitchen amLODipine (NORVASC) 10 MG tablet Take 10 mg by mouth every evening.   Marland Kitchen aspirin EC 81 MG tablet Take 81 mg by mouth daily.  . carisoprodol (SOMA) 350 MG tablet Take 350 mg by mouth daily as needed for muscle spasms.   . carvedilol (COREG) 12.5 MG tablet Take 12.5 mg by mouth 2 (two) times daily with a meal.   . HYDROcodone-acetaminophen (NORCO) 10-325 MG per tablet Take 1 tablet by mouth every 6 (six) hours as needed (pain.).   Marland Kitchen KLOR-CON M20 20 MEQ tablet Take 20 mEq by mouth daily at 3 pm.   . lisinopril-hydrochlorothiazide (PRINZIDE,ZESTORETIC) 20-12.5 MG per tablet Take 1 tablet by mouth daily.  . Multiple Vitamins-Minerals (ADULT GUMMY PO) Take 1 tablet by mouth 2 (two) times a week.  . nitroGLYCERIN (NITROSTAT) 0.3 MG SL tablet Place 0.3 mg under the tongue every 5 (five) minutes x 3 doses as needed for chest pain.   . rosuvastatin (CRESTOR) 20 MG tablet Take 20 mg by mouth daily at 3 pm.    Current Facility-Administered Medications for the 05/06/20 encounter (Office Visit) with Tamy Accardo, Aundra Dubin, MD  Medication  . Alcohol (DEHYDRATED ALCOHOL 98%) 98 % injection 0.5 mL  . triamcinolone acetonide (KENALOG) 10 MG/ML injection 10 mg  . triamcinolone acetonide (KENALOG) 10 MG/ML injection 10 mg     Allergies:   Nitrofurantoin   Social History   Socioeconomic History  . Marital status: Widowed    Spouse name: Not on file  . Number of children: Not on file  . Years of education: Not on file  . Highest education level: Not on file  Occupational History  . Not on file  Tobacco Use  . Smoking status:  Former Smoker    Types: Cigarettes    Quit date: 2008    Years since quitting: 13.9  . Smokeless tobacco: Never Used  Substance and Sexual Activity  . Alcohol use: No    Alcohol/week: 0.0 standard drinks  . Drug use: No  . Sexual activity: Not on file  Other Topics Concern  . Not on file  Social History Narrative  . Not on file   Social Determinants of Health   Financial Resource Strain:   . Difficulty of Paying Living Expenses: Not on file  Food Insecurity:   . Worried About Programme researcher, broadcasting/film/video in the Last Year: Not on file  . Ran Out of Food in the Last Year: Not on file  Transportation Needs:   . Lack of Transportation (Medical): Not on file  . Lack of Transportation (Non-Medical): Not on file  Physical Activity:   . Days of Exercise per Week: Not on file  . Minutes of Exercise per Session: Not on file  Stress:   . Feeling of Stress : Not on file  Social Connections:   . Frequency of Communication with Friends and Family: Not on file  . Frequency of Social Gatherings with Friends and Family: Not on file  . Attends Religious Services: Not on file  . Active Member of Clubs or Organizations: Not on file  . Attends Banker Meetings: Not on file  . Marital Status: Not on file     Family History: The patient's family history includes Coronary artery disease in her father; Heart attack in her mother and paternal grandmother; Other in her brother and father;  Pneumonia in her mother.  ROS:   Please see the history of present illness.    All other systems reviewed and are negative.  EKGs/Labs/Other Studies Reviewed:    The following studies were reviewed today: I reviewed records from hospitalization extensively including stress test report with no evidence of ischemia.   Recent Labs: 10/18/2019: ALT 21; BUN 12; Creatinine, Ser 0.67; Hemoglobin 13.2; Platelets 238; Potassium 3.8; Sodium 142; TSH 1.640  Recent Lipid Panel    Component Value Date/Time   CHOL  145 10/18/2019 0844   TRIG 161 (H) 10/18/2019 0844   HDL 42 10/18/2019 0844   CHOLHDL 3.5 10/18/2019 0844   LDLCALC 75 10/18/2019 0844    Physical Exam:    VS:  BP (!) 158/94   Pulse 64   Ht 5' (1.524 m)   Wt 213 lb 12.8 oz (97 kg)   SpO2 96%   BMI 41.75 kg/m     Wt Readings from Last 3 Encounters:  05/06/20 213 lb 12.8 oz (97 kg)  10/17/19 206 lb (93.4 kg)  02/26/19 207 lb (93.9 kg)     GEN: Patient is in no acute distress HEENT: Normal NECK: No JVD; No carotid bruits LYMPHATICS: No lymphadenopathy CARDIAC: Hear sounds regular, 2/6 systolic murmur at the apex. RESPIRATORY:  Clear to auscultation without rales, wheezing or rhonchi  ABDOMEN: Soft, non-tender, non-distended MUSCULOSKELETAL:  No edema; No deformity  SKIN: Warm and dry NEUROLOGIC:  Alert and oriented x 3 PSYCHIATRIC:  Normal affect   Signed, Garwin Brothers, MD  05/06/2020 8:36 AM    [facility]

## 2020-06-19 DIAGNOSIS — M25512 Pain in left shoulder: Secondary | ICD-10-CM | POA: Insufficient documentation

## 2020-06-19 DIAGNOSIS — M25571 Pain in right ankle and joints of right foot: Secondary | ICD-10-CM | POA: Insufficient documentation

## 2020-06-19 DIAGNOSIS — G8929 Other chronic pain: Secondary | ICD-10-CM

## 2020-06-19 HISTORY — DX: Morbid (severe) obesity due to excess calories: E66.01

## 2020-06-19 HISTORY — DX: Other chronic pain: G89.29

## 2020-06-19 HISTORY — DX: Pain in left shoulder: M25.512

## 2020-06-19 HISTORY — DX: Pain in right ankle and joints of right foot: M25.571

## 2020-07-16 ENCOUNTER — Telehealth: Payer: Self-pay | Admitting: Cardiology

## 2020-07-16 MED ORDER — CARVEDILOL 25 MG PO TABS: 25.0000 mg | ORAL_TABLET | Freq: Two times a day (BID) | ORAL | 3 refills | Status: AC

## 2020-07-16 NOTE — Addendum Note (Signed)
Addended by: Eleonore Chiquito on: 07/16/2020 04:02 PM   Modules accepted: Orders

## 2020-07-16 NOTE — Telephone Encounter (Signed)
Pt c/o BP issue: STAT if pt c/o blurred vision, one-sided weakness or slurred speech  1. What are your last 5 BP readings?  07/16/20 160/102 155/107 113/102  2. Are you having any other symptoms (ex. Dizziness, headache, blurred vision, passed out)? Very weak   3. What is your BP issue? Has had hypertension since yesterday morning. She is requesting a sooner appt in regards to this.

## 2020-07-17 NOTE — Telephone Encounter (Signed)
See Mychart messages

## 2020-08-04 ENCOUNTER — Encounter: Payer: Self-pay | Admitting: Cardiology

## 2020-08-04 ENCOUNTER — Other Ambulatory Visit: Payer: Self-pay

## 2020-08-04 ENCOUNTER — Ambulatory Visit: Payer: Medicare PPO | Admitting: Cardiology

## 2020-08-04 VITALS — BP 160/82 | HR 58 | Ht 60.0 in | Wt 210.2 lb

## 2020-08-04 DIAGNOSIS — I10 Essential (primary) hypertension: Secondary | ICD-10-CM

## 2020-08-04 DIAGNOSIS — E782 Mixed hyperlipidemia: Secondary | ICD-10-CM

## 2020-08-04 DIAGNOSIS — I25110 Atherosclerotic heart disease of native coronary artery with unstable angina pectoris: Secondary | ICD-10-CM | POA: Diagnosis not present

## 2020-08-04 LAB — CBC WITH DIFFERENTIAL/PLATELET
Basophils Absolute: 0 10*3/uL (ref 0.0–0.2)
Basos: 1 %
EOS (ABSOLUTE): 0.1 10*3/uL (ref 0.0–0.4)
Eos: 1 %
Hematocrit: 38.6 % (ref 34.0–46.6)
Hemoglobin: 13.1 g/dL (ref 11.1–15.9)
Immature Grans (Abs): 0 10*3/uL (ref 0.0–0.1)
Immature Granulocytes: 0 %
Lymphocytes Absolute: 1.3 10*3/uL (ref 0.7–3.1)
Lymphs: 20 %
MCH: 29.2 pg (ref 26.6–33.0)
MCHC: 33.9 g/dL (ref 31.5–35.7)
MCV: 86 fL (ref 79–97)
Monocytes Absolute: 0.6 10*3/uL (ref 0.1–0.9)
Monocytes: 10 %
Neutrophils Absolute: 4.3 10*3/uL (ref 1.4–7.0)
Neutrophils: 68 %
Platelets: 249 10*3/uL (ref 150–450)
RBC: 4.48 x10E6/uL (ref 3.77–5.28)
RDW: 12.8 % (ref 11.7–15.4)
WBC: 6.2 10*3/uL (ref 3.4–10.8)

## 2020-08-04 LAB — HEPATIC FUNCTION PANEL
ALT: 30 IU/L (ref 0–32)
AST: 16 IU/L (ref 0–40)
Albumin: 4.5 g/dL (ref 3.8–4.8)
Alkaline Phosphatase: 71 IU/L (ref 44–121)
Bilirubin Total: 0.4 mg/dL (ref 0.0–1.2)
Bilirubin, Direct: 0.12 mg/dL (ref 0.00–0.40)
Total Protein: 7 g/dL (ref 6.0–8.5)

## 2020-08-04 LAB — BASIC METABOLIC PANEL
BUN/Creatinine Ratio: 17 (ref 12–28)
BUN: 13 mg/dL (ref 8–27)
CO2: 23 mmol/L (ref 20–29)
Calcium: 9.3 mg/dL (ref 8.7–10.3)
Chloride: 103 mmol/L (ref 96–106)
Creatinine, Ser: 0.75 mg/dL (ref 0.57–1.00)
GFR calc Af Amer: 97 mL/min/{1.73_m2} (ref >59)
GFR calc non Af Amer: 85 mL/min/{1.73_m2} (ref >59)
Glucose: 108 mg/dL — ABNORMAL HIGH (ref 65–99)
Potassium: 3.8 mmol/L (ref 3.5–5.2)
Sodium: 141 mmol/L (ref 134–144)

## 2020-08-04 LAB — LIPID PANEL
Chol/HDL Ratio: 2.9 ratio (ref 0.0–4.4)
Cholesterol, Total: 162 mg/dL (ref 100–199)
HDL: 55 mg/dL (ref >39)
LDL Chol Calc (NIH): 87 mg/dL (ref 0–99)
Triglycerides: 113 mg/dL (ref 0–149)
VLDL Cholesterol Cal: 20 mg/dL (ref 5–40)

## 2020-08-04 LAB — TSH: TSH: 1.9 u[IU]/mL (ref 0.450–4.500)

## 2020-08-04 NOTE — Patient Instructions (Signed)

## 2020-08-04 NOTE — Progress Notes (Signed)
Cardiology Office Note:    Date:  08/04/2020   ID:  Marie Hughes, DOB 12-Sep-1955, MRN [id number]  PCP:  Marie Limes, FNP  Cardiologist:  Garwin Brothers, MD   Referring MD: Marie Limes, FNP    ASSESSMENT:    1. Essential hypertension   2. Coronary artery disease involving native coronary artery of native heart with unstable angina pectoris (HCC)   3. Mixed hyperlipidemia   4. Morbid obesity (HCC)    PLAN:    In order of problems listed above:  1. Coronary artery disease: Secondary prevention stressed with the patient.  Importance of compliance with diet medication stressed and she vocalized understanding.  Advised her to walk at least half an hour a day 5 days a week and she promises to do so. 2. Essential hypertension: Blood pressure stable and diet was emphasized.  She has brought multiple blood pressure readings and they are in the range of 110-120 systolic and 70 diastolic.  She is happy about it. 3. Mixed dyslipidemia: Diet was emphasized.  Lipids were reviewed.  She wants blood work today and she is fasting and we will check her blood work today. 4. Morbid obesity: Diet was emphasized.  Weight reduction was stressed and she promises to do better. 5. Patient will be seen in follow-up appointment in 6 months or earlier if the patient has any concerns    Medication Adjustments/Labs and Tests Ordered: Current medicines are reviewed at length with the patient today.  Concerns regarding medicines are outlined above.  Orders Placed This Encounter  Procedures  . Basic metabolic panel  . CBC with Differential/Platelet  . TSH  . Hepatic function panel  . Lipid panel   No orders of the defined types were placed in this encounter.    No chief complaint on file.    History of Present Illness:    Marie Hughes is a 65 y.o. female.  Patient has past medical history of coronary artery disease, essential hypertension mixed dyslipidemia and obesity.  She denies  any problems at this time and takes care of activities of daily living.  No chest pain orthopnea or PND.  At the time of my evaluation, the patient is alert awake oriented and in no distress.  She mentions to me that she is walking on a regular basis and exercising.  She is happy about it.  Past Medical History:  Diagnosis Date  . Acute cystitis without hematuria 07/30/2019   Last Assessment & Plan:  Formatting of this note might be different from the original. UA most consistent with uncomplicated UTI. No red flag features to suggest need for imaging, special testing or urgent specialty consultation at this time. Antibiotic started per visit orders. Will send UA for culture to confirm sensitivity to abx given. Recommend f/u with PCP or return to clinic or ED for worse  . Arthralgia of knee, left 05/17/2019   Last Assessment & Plan:  Formatting of this note might be different from the original. Recently provided intra-articular injection is done very well, could repeat in the future if persistent symptoms.  . Bilateral carotid bruits 10/17/2019  . Bladder problem   . Cervical post-laminectomy syndrome 08/21/2017   Last Assessment & Plan:  Formatting of this note might be different from the original. Multilevel cervical fusion with continued neck and right upper extremity radicular pain.  Stable at this time without any progression, we will hold on any further imaging at this time.  . Chest pain 12/21/2017  Last Assessment & Plan:  Intermittent. Non-radiating. No N/V/D. No vision changes.  Last Assessment & Plan:  Formatting of this note might be different from the original. No complaints of chest pain since hospital discharge.  . Chronic foot pain, right 01/24/2020   Last Assessment & Plan:  Formatting of this note might be different from the original. 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  She is followed by Podiatry.  Today I recommended obtaining a more appropriate supportive footwear, we have also discu  . Chronic left shoulder pain 06/19/2020   Last Assessment & Plan:  Formatting of this note might be different from the original. Recently referred to orthopedics by PCP, several weeks status post intra-articular injection with excellent benefit.  I did let patient know that I am happy to repeat that procedure in the future should she need it.  . Chronic pain of right ankle 06/19/2020  . Chronic right shoulder pain 11/12/2018   Last Assessment & Plan:  Formatting of this note might be different from the original. Today we will take plain films of the shoulder to further evaluate etiology of her pain.  Discussed with patient that it is very likely radicular in nature, as I do not have any findings on exam suspicious of true shoulder etiology.  . Chronically on opiate therapy 12/20/2016   Last Assessment & Plan:  Risks of this type of medication including the risk of addiction and, if misused, of death are reviewed with the patient.  The patient feels the benefits of pain relief and increased ability to engage in self-care activities improve the quality of life to outweigh these risks.  Last Assessment & Plan:  Formatting of this note might be different from the original. Will cont  . Class 2 drug-induced obesity with serious comorbidity and body mass index (BMI) of 39.0 to 39.9 in adult 12/21/2017   Last Assessment & Plan:  Formatting of this note might be different from the original. BMI Assessment: Current Body mass index is 39.91 kg/m.  Patient BMI currently is above average (>25 kg/m2); BMI follow up plan is completed . Current barriers to healthy weight management include none.  BMI Plan:  Today, Itzayanna and I have discussed methods to help address her current weight.   General weight l  . Class 2 severe obesity due to excess calories with serious  comorbidity and body mass index (BMI) of 39.0 to 39.9 in adult [facility]) 06/19/2020   Last Assessment & Plan:  Formatting of this note might be different from the original. BMI Assessment: Current Body mass index is 39.68 kg/m.  Patient BMI currently is above average (>25 kg/m2); BMI follow up plan is completed . Current barriers to healthy weight management include pain.  BMI Plan:  Today, Myrka and I have discussed methods to help address her current weight.   General weight l  . Coronary artery disease involving native coronary artery of native heart with unstable angina pectoris (HCC) 09/12/2018   Last Assessment & Plan:  Formatting of this note might be different from the original. She is followed by cardiology and has an upcoming appointment with them.   Advised to continue current medications.  . DDD (degenerative disc disease), lumbar 08/21/2017   Last Assessment & Plan:  Stble. No exacerbation of symptoms.  Last Assessment & Plan:  Formatting of this note might be different  from the original. Chronic back pain and bilateral lower extremity radicular symptoms that appear to correspond to L4/5 distribution. Review of most recent lumbar MRI from 2013 shows multilevel degenerative changes most prominent at L4-5 where there is mild spinal steno  . Degenerative cervical spinal stenosis 01/05/2016   Last Assessment & Plan:  Formatting of this note might be different from the original. We will continue current medications.  Patient understands she may benefit percutaneous interventions.  Patient has had previous cervical spine surgeries.  . Depression   . Dietary counseling 04/17/2020  . Dyspnea on exertion 04/12/2020  . Dysuria 07/24/2019   Last Assessment & Plan:  Formatting of this note might be different from the original. Assessment  Pain intermittent in the suprapubic. Mild backpain. Symptoms "come and go", then come back. Hx of kidney stone.  Reports weak urine stream Afebrile, no chills This may be a  recurrence of kidney stone UA +wbc 75, will culture  Plan  Will start Flomax and await the urine culture  . Encounter for screening mammogram for breast cancer 12/18/2019  . Essential hypertension 01/05/2016   Last Assessment & Plan:  Formatting of this note might be different from the original. Blood pressure elevated today-follow-up with primary care for further management.  . Exposure to 2019 novel coronavirus 07/01/2019   Last Assessment & Plan:  Formatting of this note might be different from the original. POCT Sars-Cov-2 Sophia NEGATIVE POCT Sars-COV-2 Aptima-COV  I have explained to the patient they must quarantine according to [facility] current guideline. They are encouraged to protect others in the household by wearing a mask, covering cough, and cleansing any area used by the patient, and if possible quarantine in   . Heart disease 12/21/2017   Last Assessment & Plan:  No reports of chest pain in approximately 8 months. Will refill Nitro SL tabs.  Last Assessment & Plan:  Formatting of this note might be different from the original. Complaining of more frequent episodes of dyspnea on exertion, and bilateral leg swelling which are chronic concerns for her.  Advise she follow-up with her PCP in regards to these concerns for further evaluat  . Hypertension   . Hypertensive urgency 01/05/2016   Last Assessment & Plan:  Formatting of this note might be different from the original. Blood pressure is controlled today. 128/70. Advise to continue monitoring. Notify office for elevation. Continue current medications and f/u with cardiology.  . IFG (impaired fasting glucose) 12/21/2017   Last Assessment & Plan:  Monitoring with blood work. POC according to results.'  Last Assessment & Plan:  Formatting of this note is different from the original. Lab Results  Component Value Date   HGBA1C 6.2 12/21/2017   Prediabetic. Monitoring with BW . Monitoring with blood work . Change in diet including a whole plant based diet,  limiting meats especially red meat encouraged.  . Avoiding unhea  . Left bundle branch block (LBBB) 10/17/2019   Formatting of this note might be different from the original. Noted to have LBBB in old records. LBBB mentioned in cardiologist's office note 10/17/19.  Last Assessment & Plan:  Formatting of this note might be different from the original. This is chronic and was noted on recent stress test. She is following with cardiology. Advised to proceed with next appointment.  She is asymptomatic today.  . Long term use of drug 12/18/2019  . Lumbar radiculitis 01/05/2016   Last Assessment & Plan:  Formatting of this note might be different from  the original. Patient will continue with current medications.  Patient understands she may benefit percutaneous interventions.  . Mixed hyperlipidemia 01/05/2016   Last Assessment & Plan:  Formatting of this note is different from the original. Problem Complexity:  Chronic problem/illness, stable  Pertinent Data: LDL Calculated (mg/dL)  Date Value  16/03/9603 48   Cholesterol (mg/dl)  Date Value  54/02/8118 135   HDL (mg/dl)  Date Value  14/78/2956 34.4 (L)   Triglycerides (mg/dl)  Date Value  21/30/8657 261 (H)   AST (U/L)  Date Value  12/18/2019 20   ALT (  . Morbid obesity (HCC) 04/16/2018  . Muscle pain   . Muscle spasm of back 08/21/2017   Last Assessment & Plan:  Formatting of this note might be different from the original. Continue p.r.n. Soma  . Obesity due to excess calories 01/05/2016   Last Assessment & Plan:  Formatting of this note might be different from the original. Diet was discussed and reviewed with the patient. Modify diet to eliminate bad fats (fast food, junk foods) and increase fiber intake. Regular exercise several times per week as appropriate, along with proper diet will help to improve overall health. Discussion concerning the health risk of obesity educated. Edu  . Otalgia, left ear 12/18/2019   Last Assessment & Plan:  Formatting of this note  might be different from the original. Physical examination revealed a normal exam.  No evidence of infection.  No evidence of cerumen impaction.  . Other chronic pain 08/21/2017   Last Assessment & Plan:  JAIAH WEIGEL is a 65 y.o.  year old female with Low back pain, Neck pain, due to Multilevel multifactorial degenerative changes in the lumbar and cervical spine, resulting in lower extremity radiculopathy bilaterally, in the setting of previous surgery in the cervical spine. Today I will refill the patient's current medications of Hydrocodone 10/325 mg PO PRN qid, and S  . Pain and swelling of lower extremity, right 11/12/2018   Last Assessment & Plan:  Formatting of this note might be different from the original. Due to patient's recent onset of worsening lower extremity pain, with increased unilateral swelling and diffuse calf tenderness of the right extremity, we recommend she go for a right lower extremity ultrasound to rule out a DVT.  Marland Kitchen Pain medication agreement 08/10/2018   Last Assessment & Plan:  There is a current opioid agreement on file within the clinic and urine drug screens will be collected as needed to be in compliance with clinic policies.  Last Assessment & Plan:  Formatting of this note might be different from the original. Review of prior UDS results are consistent with regimen.  [place] controlled substance registry reviewed and is consistent wi  . Pre-syncope 04/12/2020   Last Assessment & Plan:  Formatting of this note might be different from the original. No further episodes since hospital discharge.  . Recurrent cystitis 12/18/2019  . Recurrent major depressive disorder, in partial remission (HCC) 01/05/2016   Last Assessment & Plan:  Formatting of this note might be different from the original. This is a chronic problem and is stable.  She denies suicidal or homicidal ideations.  Advised to continue current medications.  . Swelling   . Thoracic radiculopathy 02/11/2019    Last Assessment & Plan:  Formatting of this note might be different from the original. Plain films of the thoracic spine completed after last appointment show ACDF with mid lower cervical spine with thoracic vertebral body alignment and  disc spaces normal.  Responded well to previous oral steroids.  Have discussed possibility of C7-T1 CESI should her symptoms return.  . Uncomplicated opioid dependence (HCC) 01/05/2016  . Vitamin D deficiency 12/18/2019    Past Surgical History:  Procedure Laterality Date  . ABDOMINAL HYSTERECTOMY    . FOOT SURGERY    . HAND SURGERY     BOTH HANDS  . LEFT HEART CATH AND CORONARY ANGIOGRAPHY N/A 08/20/2018   Procedure: LEFT HEART CATH AND CORONARY ANGIOGRAPHY;  Surgeon: Corky Crafts, MD;  Location: [facility] INVASIVE CV LAB;  Service: Cardiovascular;  Laterality: N/A;  . NECK SURGERIES      Current Medications: Current Meds  Medication Sig  . amLODipine (NORVASC) 10 MG tablet Take 10 mg by mouth every evening.   Marland Kitchen aspirin EC 81 MG tablet Take 81 mg by mouth daily.  . carisoprodol (SOMA) 350 MG tablet Take 350 mg by mouth daily as needed for muscle spasms.   . carvedilol (COREG) 25 MG tablet Take 1 tablet (25 mg total) by mouth 2 (two) times daily with a meal.  . HYDROcodone-acetaminophen (NORCO) 10-325 MG per tablet Take 1 tablet by mouth every 6 (six) hours as needed for moderate pain.  Marland Kitchen KLOR-CON M20 20 MEQ tablet Take 20 mEq by mouth daily at 3 pm.   . lisinopril-hydrochlorothiazide (PRINZIDE,ZESTORETIC) 20-12.5 MG per tablet Take 1 tablet by mouth daily.  . Multiple Vitamins-Minerals (ADULT GUMMY PO) Take 1 tablet by mouth 2 (two) times a week.  . rosuvastatin (CRESTOR) 20 MG tablet Take 20 mg by mouth daily.   Current Facility-Administered Medications for the 08/04/20 encounter (Office Visit) with Revankar, Aundra Dubin, MD  Medication  . Alcohol (DEHYDRATED ALCOHOL 98%) 98 % injection 0.5 mL  . triamcinolone acetonide (KENALOG) 10 MG/ML injection 10 mg  .  triamcinolone acetonide (KENALOG) 10 MG/ML injection 10 mg     Allergies:   Nitrofurantoin   Social History   Socioeconomic History  . Marital status: Widowed    Spouse name: Not on file  . Number of children: Not on file  . Years of education: Not on file  . Highest education level: Not on file  Occupational History  . Not on file  Tobacco Use  . Smoking status: Former Smoker    Types: Cigarettes    Quit date: 2008    Years since quitting: 14.1  . Smokeless tobacco: Never Used  Substance and Sexual Activity  . Alcohol use: No    Alcohol/week: 0.0 standard drinks  . Drug use: No  . Sexual activity: Not on file  Other Topics Concern  . Not on file  Social History Narrative  . Not on file   Social Determinants of Health   Financial Resource Strain: Not on file  Food Insecurity: Not on file  Transportation Needs: Not on file  Physical Activity: Not on file  Stress: Not on file  Social Connections: Not on file     Family History: The patient's family history includes Coronary artery disease in her father; Heart attack in her mother and paternal grandmother; Other in her brother and father; Pneumonia in her mother.  ROS:   Please see the history of present illness.    All other systems reviewed and are negative.  EKGs/Labs/Other Studies Reviewed:    The following studies were reviewed today: I discussed my findings with the patient at length.   Recent Labs: 10/18/2019: ALT 21; BUN 12; Creatinine, Ser 0.67; Hemoglobin 13.2; Platelets 238; Potassium  3.8; Sodium 142; TSH 1.640  Recent Lipid Panel    Component Value Date/Time   CHOL 145 10/18/2019 0844   TRIG 161 (H) 10/18/2019 0844   HDL 42 10/18/2019 0844   CHOLHDL 3.5 10/18/2019 0844   LDLCALC 75 10/18/2019 0844    Physical Exam:    VS:  BP (!) 160/82   Pulse (!) 58   Ht 5' (1.524 m)   Wt 210 lb 3.2 oz (95.3 kg)   SpO2 98%   BMI 41.05 kg/m     Wt Readings from Last 3 Encounters:  08/04/20 210 lb  3.2 oz (95.3 kg)  05/06/20 213 lb 12.8 oz (97 kg)  10/17/19 206 lb (93.4 kg)     GEN: Patient is in no acute distress HEENT: Normal NECK: No JVD; No carotid bruits LYMPHATICS: No lymphadenopathy CARDIAC: Hear sounds regular, 2/6 systolic murmur at the apex. RESPIRATORY:  Clear to auscultation without rales, wheezing or rhonchi  ABDOMEN: Soft, non-tender, non-distended MUSCULOSKELETAL:  No edema; No deformity  SKIN: Warm and dry NEUROLOGIC:  Alert and oriented x 3 PSYCHIATRIC:  Normal affect   Signed, Garwin Brothers, MD  08/04/2020 9:10 AM    [facility]

## 2020-09-28 ENCOUNTER — Other Ambulatory Visit: Payer: Self-pay

## 2020-09-28 NOTE — Telephone Encounter (Signed)
Refill request for Carvedilol 12.5 mg denied. Physician on request not with [facility] and our records show patient taking 25 mg twice daily and recent refill was sent 07/16/20 for 25 mg # 180 with 3 additional refills to [facility] [place]

## 2020-10-05 IMAGING — DX DG CHEST 2V
2 series · 2 of 2 positions shown · non-contrast
Comparison: None.

CLINICAL DATA: Pt states that she has been having SOB and low heart
rate X 1 month

EXAM:
CHEST - 2 VIEW

[chest pa]
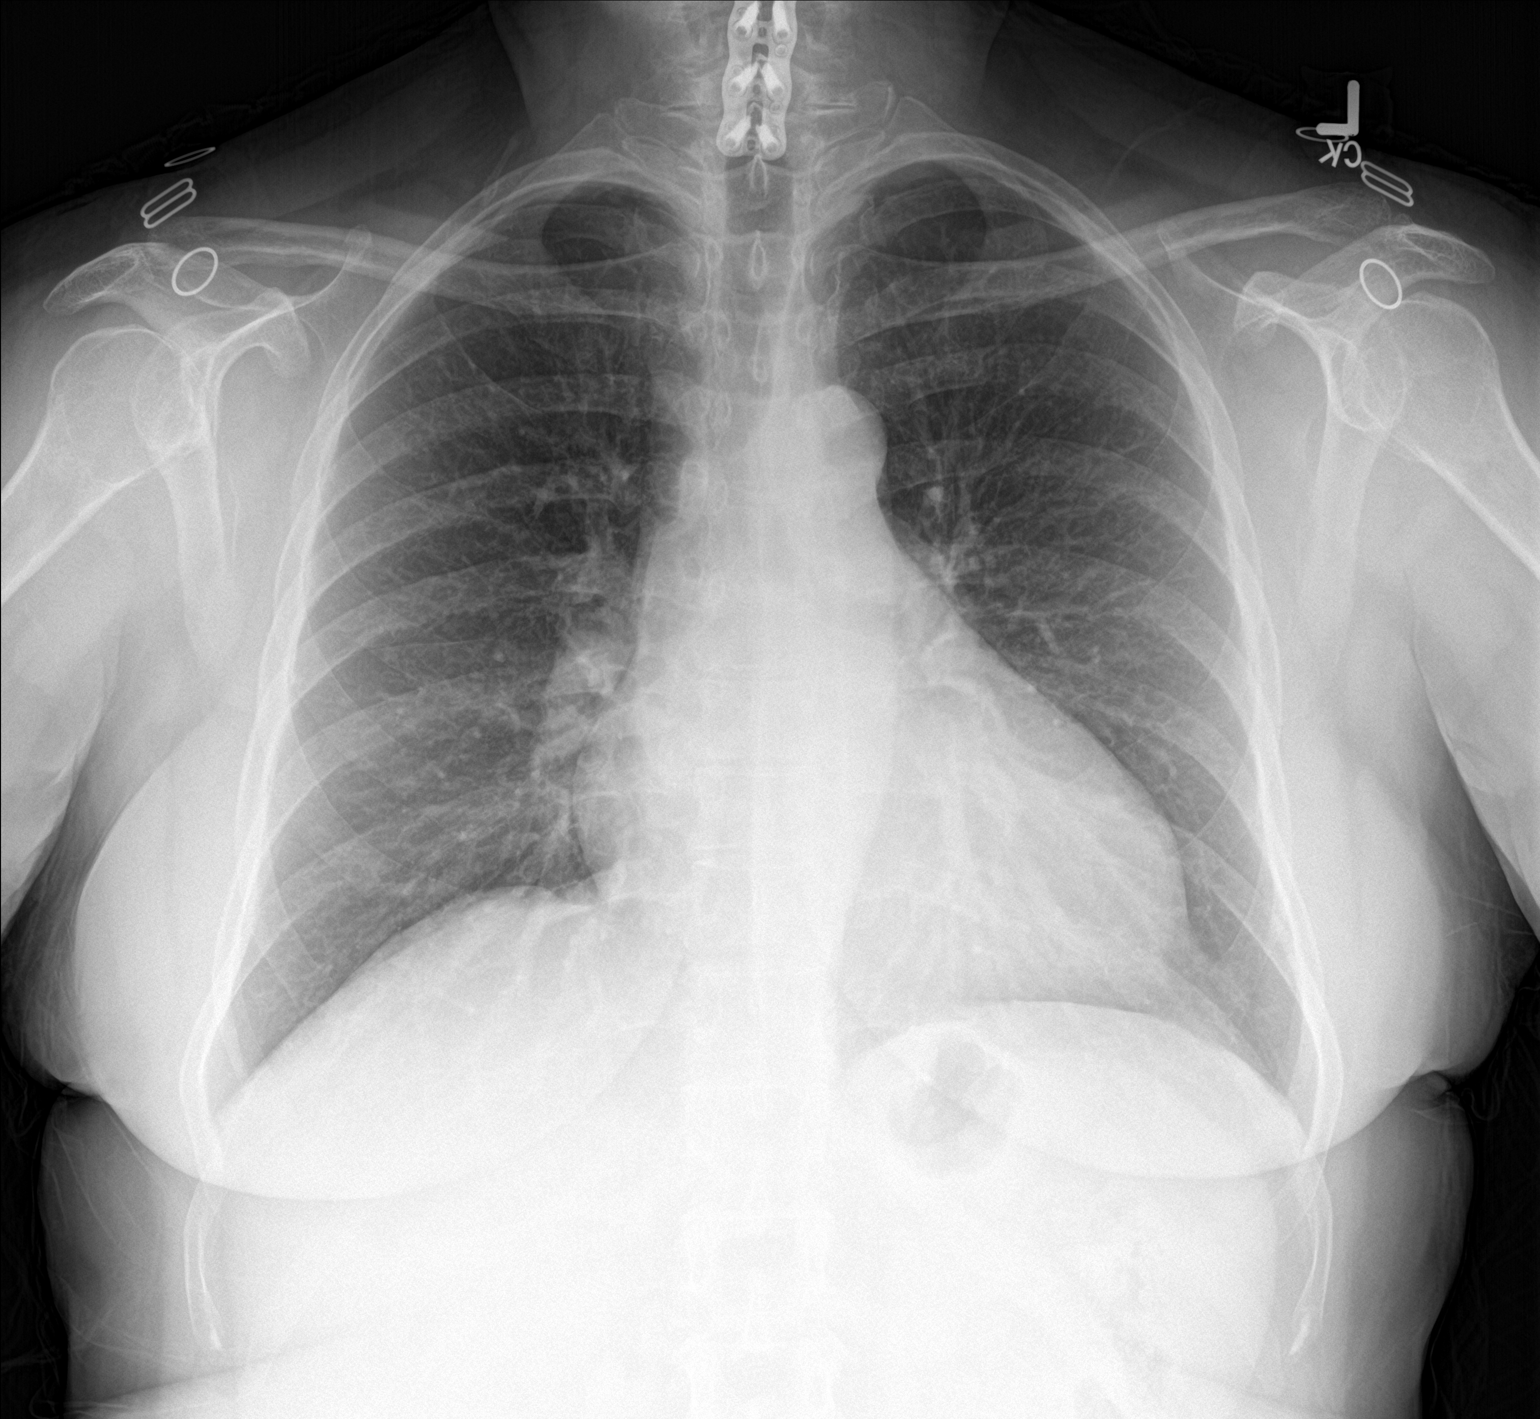

[chest lat]
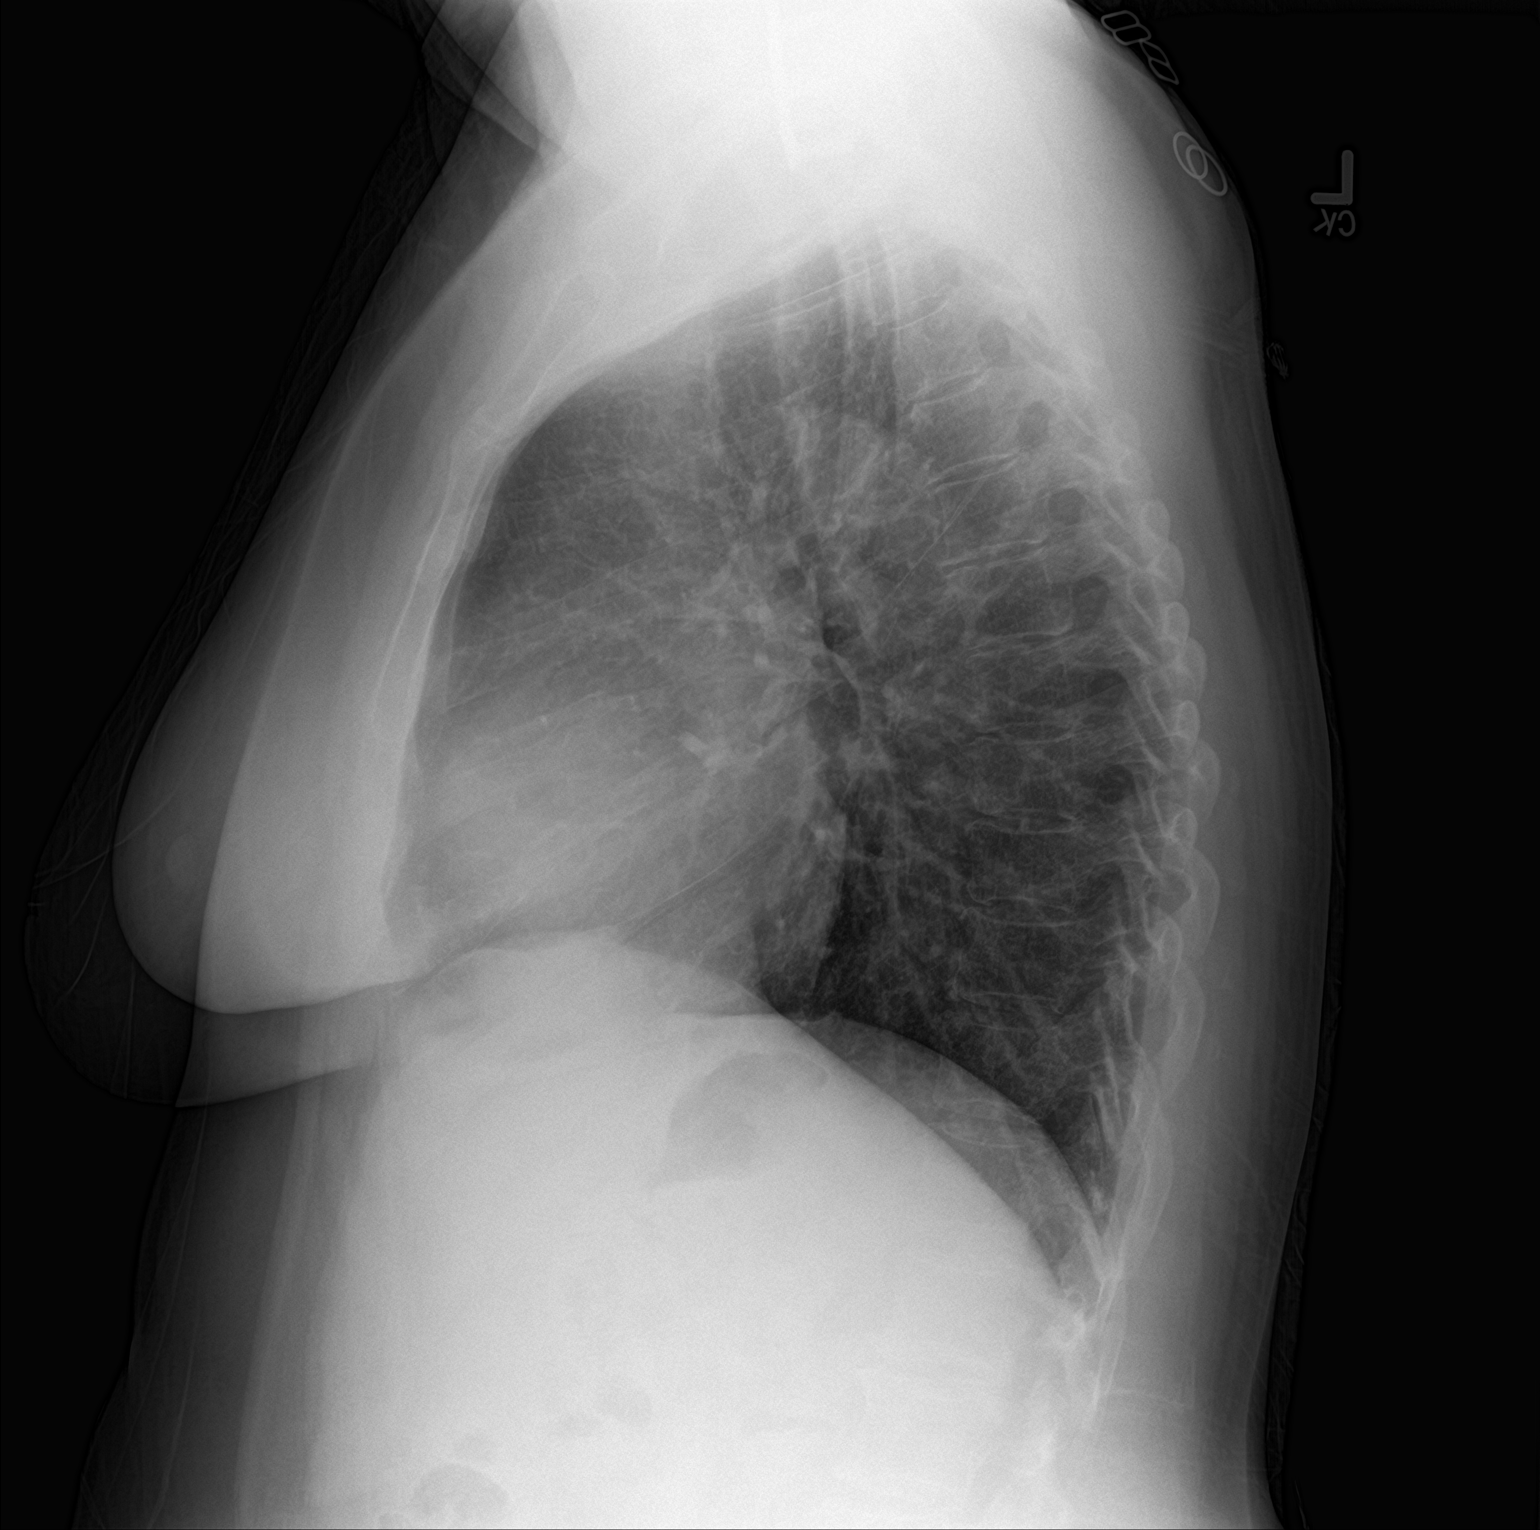

[2 of 2 positions shown; findings below may reference images not displayed]

FINDINGS: The heart size and mediastinal contours are within normal limits.
Both lungs are clear. The visualized skeletal structures are
unremarkable.
IMPRESSION: No active cardiopulmonary disease.

## 2020-12-17 DIAGNOSIS — E119 Type 2 diabetes mellitus without complications: Secondary | ICD-10-CM

## 2020-12-17 HISTORY — DX: Type 2 diabetes mellitus without complications: E11.9

## 2020-12-18 DIAGNOSIS — R42 Dizziness and giddiness: Secondary | ICD-10-CM

## 2020-12-18 DIAGNOSIS — R110 Nausea: Secondary | ICD-10-CM

## 2020-12-18 DIAGNOSIS — B349 Viral infection, unspecified: Secondary | ICD-10-CM | POA: Insufficient documentation

## 2020-12-18 DIAGNOSIS — Z20822 Contact with and (suspected) exposure to covid-19: Secondary | ICD-10-CM

## 2020-12-18 HISTORY — DX: Dizziness and giddiness: R42

## 2020-12-18 HISTORY — DX: Nausea: R11.0

## 2020-12-18 HISTORY — DX: Contact with and (suspected) exposure to covid-19: Z20.822

## 2020-12-18 HISTORY — DX: Viral infection, unspecified: B34.9

## 2021-02-02 ENCOUNTER — Other Ambulatory Visit: Payer: Self-pay

## 2021-02-03 ENCOUNTER — Other Ambulatory Visit: Payer: Self-pay

## 2021-02-03 ENCOUNTER — Encounter: Payer: Self-pay | Admitting: Cardiology

## 2021-02-03 ENCOUNTER — Ambulatory Visit: Payer: Medicare PPO | Admitting: Cardiology

## 2021-02-03 VITALS — BP 164/100 | HR 74 | Ht 60.0 in | Wt 213.6 lb

## 2021-02-03 DIAGNOSIS — I10 Essential (primary) hypertension: Secondary | ICD-10-CM

## 2021-02-03 DIAGNOSIS — E119 Type 2 diabetes mellitus without complications: Secondary | ICD-10-CM

## 2021-02-03 DIAGNOSIS — E782 Mixed hyperlipidemia: Secondary | ICD-10-CM | POA: Diagnosis not present

## 2021-02-03 DIAGNOSIS — I25110 Atherosclerotic heart disease of native coronary artery with unstable angina pectoris: Secondary | ICD-10-CM

## 2021-02-03 NOTE — Progress Notes (Signed)
Cardiology Office Note:    Date:  02/03/2021   ID:  Marie Hughes, DOB 1956/02/09, MRN [id number]  PCP:  Malva Limes, FNP  Cardiologist:  Garwin Brothers, MD   Referring MD: Malva Limes, FNP    ASSESSMENT:    1. Essential hypertension   2. Coronary artery disease involving native coronary artery of native heart with unstable angina pectoris (HCC)   3. Type 2 diabetes mellitus without complication, without long-term current use of insulin (HCC)   4. Mixed hyperlipidemia   5. Morbid obesity (HCC)    PLAN:    In order of problems listed above:  Nonobstructive coronary artery disease: Secondary prevention stressed with the patient.  Importance of compliance with diet medication stressed and she vocalized understanding.  She was advised to walk at least half an hour a day 5 days a week and she promises to do so. Essential hypertension: Blood pressure stable and diet was emphasized.  Lifestyle modification urged salt intake issues were discussed.  She mentions to me that her blood pressures are fine at home. Mixed dyslipidemia: Lipids reviewed she is fasting today and will have complete blood work.  Exercise stressed diet emphasized. Morbid obesity: Weight reduction stressed risks of obesity revisited.  She promises to do better. Patient will be seen in follow-up appointment in 6 months or earlier if the patient has any concerns    Medication Adjustments/Labs and Tests Ordered: Current medicines are reviewed at length with the patient today.  Concerns regarding medicines are outlined above.  Orders Placed This Encounter  Procedures   Basic metabolic panel   Hepatic function panel   Lipid panel   TSH   CBC with Differential/Platelet   EKG 12-Lead    No orders of the defined types were placed in this encounter.    No chief complaint on file.    History of Present Illness:    Marie Hughes is a 65 y.o. female.  Patient has past medical history of essential  hypertension, dyslipidemia, nonobstructive coronary artery disease and morbid obesity.  She denies any problems at this time and takes care of activities of daily living.  No chest pain orthopnea or PND.  At the time of my evaluation, the patient is alert awake oriented and in no distress.  She leads a sedentary lifestyle and does not exercise on a regular basis.  Past Medical History:  Diagnosis Date   Acute cystitis without hematuria 07/30/2019   Last Assessment & Plan:  Formatting of this note might be different from the original. UA most consistent with uncomplicated UTI. No red flag features to suggest need for imaging, special testing or urgent specialty consultation at this time. Antibiotic started per visit orders. Will send UA for culture to confirm sensitivity to abx given. Recommend f/u with PCP or return to clinic or ED for worse   Arthralgia of knee, left 05/17/2019   Last Assessment & Plan:  Formatting of this note might be different from the original. Recently provided intra-articular injection is done very well, could repeat in the future if persistent symptoms.   Bilateral carotid bruits 10/17/2019   Bladder problem    Cervical post-laminectomy syndrome 08/21/2017   Last Assessment & Plan:  Formatting of this note might be different from the original. Multilevel cervical fusion with continued neck and right upper extremity radicular pain.  Stable at this time without any progression, we will hold on any further imaging at this time.   Chest pain 12/21/2017  Last Assessment & Plan:  Intermittent. Non-radiating. No N/V/D. No vision changes.  Last Assessment & Plan:  Formatting of this note might be different from the original. No complaints of chest pain since hospital discharge.   Chronic foot pain, right 01/24/2020   Last Assessment & Plan:  Formatting of this note might be different from the original. 65 year old female with chronic pain of multiple etiologies, at this time most limiting  complaint is right foot pain secondary to refractory plantar fasciitis status post surgical intervention.  She is followed by Podiatry.  Today I recommended obtaining a more appropriate supportive footwear, we have also discu   Chronic left shoulder pain 06/19/2020   Last Assessment & Plan:  Formatting of this note might be different from the original. Recently referred to orthopedics by PCP, several weeks status post intra-articular injection with excellent benefit.  I did let patient know that I am happy to repeat that procedure in the future should she need it.   Chronic pain of right ankle 06/19/2020   Chronic right shoulder pain 11/12/2018   Last Assessment & Plan:  Formatting of this note might be different from the original. Today we will take plain films of the shoulder to further evaluate etiology of her pain.  Discussed with patient that it is very likely radicular in nature, as I do not have any findings on exam suspicious of true shoulder etiology.   Chronically on opiate therapy 12/20/2016   Last Assessment & Plan:  Risks of this type of medication including the risk of addiction and, if misused, of death are reviewed with the patient.  The patient feels the benefits of pain relief and increased ability to engage in self-care activities improve the quality of life to outweigh these risks.  Last Assessment & Plan:  Formatting of this note might be different from the original. Will cont   Class 2 drug-induced obesity with serious comorbidity and body mass index (BMI) of 39.0 to 39.9 in adult 12/21/2017   Last Assessment & Plan:  Formatting of this note might be different from the original. BMI Assessment: Current Body mass index is 39.91 kg/m.  Patient BMI currently is above average (>25 kg/m2); BMI follow up plan is completed . Current barriers to healthy weight management include none.  BMI Plan:  Today, Charika and I have discussed methods to help address her current weight.   General weight l    Class 2 severe obesity due to excess calories with serious comorbidity and body mass index (BMI) of 39.0 to 39.9 in adult [facility]) 06/19/2020   Last Assessment & Plan:  Formatting of this note might be different from the original. BMI Assessment: Current Body mass index is 39.68 kg/m.  Patient BMI currently is above average (>25 kg/m2); BMI follow up plan is completed . Current barriers to healthy weight management include pain.  BMI Plan:  Today, Aracelly and I have discussed methods to help address her current weight.   General weight l   Coronary artery disease involving native coronary artery of native heart with unstable angina pectoris (HCC) 09/12/2018   Last Assessment & Plan:  Formatting of this note might be different from the original. She is followed by cardiology and has an upcoming appointment with them.   Advised to continue current medications.   DDD (degenerative disc disease), lumbar 08/21/2017   Last Assessment & Plan:  Stble. No exacerbation of symptoms.  Last Assessment & Plan:  Formatting of this note might be different  from the original. Chronic back pain and bilateral lower extremity radicular symptoms that appear to correspond to L4/5 distribution. Review of most recent lumbar MRI from 2013 shows multilevel degenerative changes most prominent at L4-5 where there is mild spinal steno   Degenerative cervical spinal stenosis 01/05/2016   Last Assessment & Plan:  Formatting of this note might be different from the original. We will continue current medications.  Patient understands she may benefit percutaneous interventions.  Patient has had previous cervical spine surgeries.   Depression    Dietary counseling 04/17/2020   Dizziness 12/18/2020   Last Assessment & Plan:  Formatting of this note might be different from the original. Patient complains of dizziness and overall not feeling well.  She does complain of feelings of hot and cold as well as sweats.  She admits working at the [employer] is reunion in the direct heat.  She did this for several days.  She feels she may be dehydrated somewhat.  She has no focal neuro def   Dyspnea on exertion 04/12/2020   Dysuria 07/24/2019   Last Assessment & Plan:  Formatting of this note might be different from the original. Assessment  Pain intermittent in the suprapubic. Mild backpain. Symptoms "come and go", then come back. Hx of kidney stone.  Reports weak urine stream Afebrile, no chills This may be a recurrence of kidney stone UA +wbc 75, will culture  Plan  Will start Flomax and await the urine culture   Encounter for screening mammogram for breast cancer 12/18/2019   Essential hypertension 01/05/2016   Last Assessment & Plan:  Formatting of this note might be different from the original. Blood pressure elevated today-follow-up with primary care for further management.   Exposure to 2019 novel coronavirus 07/01/2019   Last Assessment & Plan:  Formatting of this note might be different from the original. POCT Sars-Cov-2 Sophia NEGATIVE POCT Sars-COV-2 Aptima-COV  I have explained to the patient they must quarantine according to [facility] current guideline. They are encouraged to protect others in the household by wearing a mask, covering cough, and cleansing any area used by the patient, and if possible quarantine in    Heart disease 12/21/2017   Last Assessment & Plan:  No reports of chest pain in approximately 8 months. Will refill Nitro SL tabs.  Last Assessment & Plan:  Formatting of this note might be different from the original. Complaining of more frequent episodes of dyspnea on exertion, and bilateral leg swelling which are chronic concerns for her.  Advise she follow-up with her PCP in regards to these concerns for further evaluat   Hypertension    Hypertensive urgency 01/05/2016   Last Assessment & Plan:  Formatting of this note might be different from the original. Blood pressure is controlled today. 128/70. Advise to continue  monitoring. Notify office for elevation. Continue current medications and f/u with cardiology.   IFG (impaired fasting glucose) 12/21/2017   Last Assessment & Plan:  Monitoring with blood work. POC according to results.'  Last Assessment & Plan:  Formatting of this note is different from the original. Lab Results  Component Value Date   HGBA1C 6.2 12/21/2017   Prediabetic. Monitoring with BW  Monitoring with blood work  Change in diet including a whole plant based diet, limiting meats especially red meat encouraged.   Avoiding unhea   Left bundle branch block (LBBB) 10/17/2019   Formatting of this note might be different from the original. Noted to  have LBBB in old records. LBBB mentioned in cardiologist's office note 10/17/19.  Last Assessment & Plan:  Formatting of this note might be different from the original. This is chronic and was noted on recent stress test. She is following with cardiology. Advised to proceed with next appointment.  She is asymptomatic today.   Long term use of drug 12/18/2019   Lumbar radiculitis 01/05/2016   Last Assessment & Plan:  Formatting of this note might be different from the original. Patient will continue with current medications.  Patient understands she may benefit percutaneous interventions.   Mixed hyperlipidemia 01/05/2016   Last Assessment & Plan:  Formatting of this note is different from the original. Problem Complexity:  Chronic problem/illness, stable  Pertinent Data: LDL Calculated (mg/dL)  Date Value  44/06/270 48   Cholesterol (mg/dl)  Date Value  53/66/4403 135   HDL (mg/dl)  Date Value  47/42/5956 34.4 (L)   Triglycerides (mg/dl)  Date Value  38/75/6433 261 (H)   AST (U/L)  Date Value  12/18/2019 20   ALT (   Morbid obesity (HCC) 04/16/2018   Muscle pain    Muscle spasm of back 08/21/2017   Last Assessment & Plan:  Formatting of this note might be different from the original. Continue p.r.n. Soma   Nausea 12/18/2020   Obesity due to excess calories 01/05/2016    Last Assessment & Plan:  Formatting of this note might be different from the original. Diet was discussed and reviewed with the patient. Modify diet to eliminate bad fats (fast food, junk foods) and increase fiber intake. Regular exercise several times per week as appropriate, along with proper diet will help to improve overall health. Discussion concerning the health risk of obesity educated. Edu   Otalgia, left ear 12/18/2019   Last Assessment & Plan:  Formatting of this note might be different from the original. Physical examination revealed a normal exam.  No evidence of infection.  No evidence of cerumen impaction.   Other chronic pain 08/21/2017   Last Assessment & Plan:  ALYZAE HAWKEY is a 65 y.o.  year old female with Low back pain, Neck pain, due to Multilevel multifactorial degenerative changes in the lumbar and cervical spine, resulting in lower extremity radiculopathy bilaterally, in the setting of previous surgery in the cervical spine. Today I will refill the patient's current medications of Hydrocodone 10/325 mg PO PRN qid, and S   Pain and swelling of lower extremity, right 11/12/2018   Last Assessment & Plan:  Formatting of this note might be different from the original. Due to patient's recent onset of worsening lower extremity pain, with increased unilateral swelling and diffuse calf tenderness of the right extremity, we recommend she go for a right lower extremity ultrasound to rule out a DVT.   Pain medication agreement 08/10/2018   Last Assessment & Plan:  There is a current opioid agreement on file within the clinic and urine drug screens will be collected as needed to be in compliance with clinic policies.  Last Assessment & Plan:  Formatting of this note might be different from the original. Review of prior UDS results are consistent with regimen.  [place] controlled substance registry reviewed and is consistent wi   Pre-syncope 04/12/2020   Last Assessment & Plan:  Formatting  of this note might be different from the original. No further episodes since hospital discharge.   Recurrent cystitis 12/18/2019   Recurrent major depressive disorder, in partial remission (HCC) 01/05/2016  Last Assessment & Plan:  Formatting of this note might be different from the original. This is a chronic problem and is stable.  She denies suicidal or homicidal ideations.  Advised to continue current medications.   Swelling    Thoracic radiculopathy 02/11/2019   Last Assessment & Plan:  Formatting of this note might be different from the original. Plain films of the thoracic spine completed after last appointment show ACDF with mid lower cervical spine with thoracic vertebral body alignment and disc spaces normal.  Responded well to previous oral steroids.  Have discussed possibility of C7-T1 CESI should her symptoms return.   Type 2 diabetes mellitus without complication, without long-term current use of insulin (HCC) 12/17/2020   Last Assessment & Plan:  Formatting of this note might be different from the original. Encourage her today to focus on losing weight, decreasing carbohydrate intake.  Hopefully she can make some improvements here to get this better controlled.   Uncomplicated opioid dependence (HCC) 01/05/2016   Viral syndrome 12/18/2020   Vitamin D deficiency 12/18/2019    Past Surgical History:  Procedure Laterality Date   ABDOMINAL HYSTERECTOMY     FOOT SURGERY     HAND SURGERY     BOTH HANDS   LEFT HEART CATH AND CORONARY ANGIOGRAPHY N/A 08/20/2018   Procedure: LEFT HEART CATH AND CORONARY ANGIOGRAPHY;  Surgeon: Corky Crafts, MD;  Location: [facility] INVASIVE CV LAB;  Service: Cardiovascular;  Laterality: N/A;   NECK SURGERIES      Current Medications: Current Meds  Medication Sig   amitriptyline (ELAVIL) 10 MG tablet Take 10 mg by mouth at bedtime.   amLODipine (NORVASC) 10 MG tablet Take 10 mg by mouth every evening.    aspirin EC 81 MG tablet Take 81 mg by mouth daily.    carisoprodol (SOMA) 350 MG tablet Take 350 mg by mouth daily as needed for muscle spasms.    carvedilol (COREG) 25 MG tablet Take 1 tablet (25 mg total) by mouth 2 (two) times daily with a meal.   HYDROcodone-acetaminophen (NORCO) 10-325 MG per tablet Take 1 tablet by mouth every 6 (six) hours as needed for moderate pain.   KLOR-CON M20 20 MEQ tablet Take 20 mEq by mouth daily at 3 pm.    lisinopril-hydrochlorothiazide (PRINZIDE,ZESTORETIC) 20-12.5 MG per tablet Take 1 tablet by mouth daily.   Multiple Vitamins-Minerals (ADULT GUMMY PO) Take 1 tablet by mouth 2 (two) times a week.   nitroGLYCERIN (NITROSTAT) 0.4 MG SL tablet Place 0.4 mg under the tongue every 5 (five) minutes as needed for chest pain.   rosuvastatin (CRESTOR) 20 MG tablet Take 20 mg by mouth daily.     Allergies:   Nitrofurantoin   Social History   Socioeconomic History   Marital status: Widowed    Spouse name: Not on file   Number of children: Not on file   Years of education: Not on file   Highest education level: Not on file  Occupational History   Not on file  Tobacco Use   Smoking status: Former    Types: Cigarettes    Quit date: 2008    Years since quitting: 14.6   Smokeless tobacco: Never  Substance and Sexual Activity   Alcohol use: No    Alcohol/week: 0.0 standard drinks   Drug use: No   Sexual activity: Not on file  Other Topics Concern   Not on file  Social History Narrative   Not on file   Social Determinants of  Health   Financial Resource Strain: Not on file  Food Insecurity: Not on file  Transportation Needs: Not on file  Physical Activity: Not on file  Stress: Not on file  Social Connections: Not on file     Family History: The patient's family history includes Coronary artery disease in her father; Heart attack in her mother and paternal grandmother; Other in her brother and father; Pneumonia in her mother.  ROS:   Please see the history of present illness.    All other systems  reviewed and are negative.  EKGs/Labs/Other Studies Reviewed:    The following studies were reviewed today: I discussed my findings with the patient at length EKG reveals sinus rhythm and left bundle branch block and nonspecific ST-T changes   Recent Labs: 08/04/2020: ALT 30; BUN 13; Creatinine, Ser 0.75; Hemoglobin 13.1; Platelets 249; Potassium 3.8; Sodium 141; TSH 1.900  Recent Lipid Panel    Component Value Date/Time   CHOL 162 08/04/2020 0914   TRIG 113 08/04/2020 0914   HDL 55 08/04/2020 0914   CHOLHDL 2.9 08/04/2020 0914   LDLCALC 87 08/04/2020 0914    Physical Exam:    VS:  BP (!) 164/100   Pulse 74   Ht 5' (1.524 m)   Wt 213 lb 9.6 oz (96.9 kg)   SpO2 97%   BMI 41.72 kg/m     Wt Readings from Last 3 Encounters:  02/03/21 213 lb 9.6 oz (96.9 kg)  08/04/20 210 lb 3.2 oz (95.3 kg)  05/06/20 213 lb 12.8 oz (97 kg)     GEN: Patient is in no acute distress HEENT: Normal NECK: No JVD; No carotid bruits LYMPHATICS: No lymphadenopathy CARDIAC: Hear sounds regular, 2/6 systolic murmur at the apex. RESPIRATORY:  Clear to auscultation without rales, wheezing or rhonchi  ABDOMEN: Soft, non-tender, non-distended MUSCULOSKELETAL:  No edema; No deformity  SKIN: Warm and dry NEUROLOGIC:  Alert and oriented x 3 PSYCHIATRIC:  Normal affect   Signed, Garwin Brothers, MD  02/03/2021 9:04 AM    [facility]

## 2021-02-03 NOTE — Patient Instructions (Addendum)

## 2021-02-03 NOTE — Progress Notes (Signed)
g

## 2021-02-04 LAB — CBC WITH DIFFERENTIAL/PLATELET
Basophils Absolute: 0 10*3/uL (ref 0.0–0.2)
Basos: 1 %
EOS (ABSOLUTE): 0.1 10*3/uL (ref 0.0–0.4)
Eos: 3 %
Hematocrit: 39.8 % (ref 34.0–46.6)
Hemoglobin: 13 g/dL (ref 11.1–15.9)
Immature Grans (Abs): 0 10*3/uL (ref 0.0–0.1)
Immature Granulocytes: 0 %
Lymphocytes Absolute: 1.5 10*3/uL (ref 0.7–3.1)
Lymphs: 30 %
MCH: 29 pg (ref 26.6–33.0)
MCHC: 32.7 g/dL (ref 31.5–35.7)
MCV: 89 fL (ref 79–97)
Monocytes Absolute: 0.5 10*3/uL (ref 0.1–0.9)
Monocytes: 10 %
Neutrophils Absolute: 2.9 10*3/uL (ref 1.4–7.0)
Neutrophils: 56 %
Platelets: 236 10*3/uL (ref 150–450)
RBC: 4.48 x10E6/uL (ref 3.77–5.28)
RDW: 13 % (ref 11.7–15.4)
WBC: 5.1 10*3/uL (ref 3.4–10.8)

## 2021-02-04 LAB — LIPID PANEL
Chol/HDL Ratio: 3.4 ratio (ref 0.0–4.4)
Cholesterol, Total: 154 mg/dL (ref 100–199)
HDL: 45 mg/dL (ref >39)
LDL Chol Calc (NIH): 77 mg/dL (ref 0–99)
Triglycerides: 190 mg/dL — ABNORMAL HIGH (ref 0–149)
VLDL Cholesterol Cal: 32 mg/dL (ref 5–40)

## 2021-02-04 LAB — HEPATIC FUNCTION PANEL
ALT: 20 IU/L (ref 0–32)
AST: 16 IU/L (ref 0–40)
Albumin: 4.5 g/dL (ref 3.8–4.8)
Alkaline Phosphatase: 77 IU/L (ref 44–121)
Bilirubin Total: 0.4 mg/dL (ref 0.0–1.2)
Bilirubin, Direct: 0.12 mg/dL (ref 0.00–0.40)
Total Protein: 6.6 g/dL (ref 6.0–8.5)

## 2021-02-04 LAB — BASIC METABOLIC PANEL
BUN/Creatinine Ratio: 20 (ref 12–28)
BUN: 16 mg/dL (ref 8–27)
CO2: 23 mmol/L (ref 20–29)
Calcium: 9.4 mg/dL (ref 8.7–10.3)
Chloride: 103 mmol/L (ref 96–106)
Creatinine, Ser: 0.8 mg/dL (ref 0.57–1.00)
Glucose: 103 mg/dL — ABNORMAL HIGH (ref 65–99)
Potassium: 4.5 mmol/L (ref 3.5–5.2)
Sodium: 141 mmol/L (ref 134–144)
eGFR: 82 mL/min/{1.73_m2} (ref >59)

## 2021-02-04 LAB — TSH: TSH: 0.952 u[IU]/mL (ref 0.450–4.500)

## 2021-04-12 DIAGNOSIS — E088 Diabetes mellitus due to underlying condition with unspecified complications: Secondary | ICD-10-CM

## 2021-04-12 HISTORY — DX: Diabetes mellitus due to underlying condition with unspecified complications: E08.8

## 2021-04-13 DIAGNOSIS — R2243 Localized swelling, mass and lump, lower limb, bilateral: Secondary | ICD-10-CM | POA: Insufficient documentation

## 2021-04-13 HISTORY — DX: Localized swelling, mass and lump, lower limb, bilateral: R22.43

## 2021-07-29 DIAGNOSIS — R030 Elevated blood-pressure reading, without diagnosis of hypertension: Secondary | ICD-10-CM

## 2021-07-29 HISTORY — DX: Elevated blood-pressure reading, without diagnosis of hypertension: R03.0

## 2021-08-03 DIAGNOSIS — R1012 Left upper quadrant pain: Secondary | ICD-10-CM

## 2021-08-03 HISTORY — DX: Left upper quadrant pain: R10.12

## 2021-08-19 ENCOUNTER — Ambulatory Visit: Payer: Medicare PPO | Admitting: Cardiology

## 2021-09-07 ENCOUNTER — Other Ambulatory Visit: Payer: Self-pay

## 2021-09-07 ENCOUNTER — Ambulatory Visit: Payer: Medicare PPO | Admitting: Cardiology

## 2021-09-07 ENCOUNTER — Encounter: Payer: Self-pay | Admitting: Cardiology

## 2021-09-07 VITALS — BP 158/80 | HR 62 | Resp 18 | Ht 61.5 in | Wt 213.0 lb

## 2021-09-07 DIAGNOSIS — I10 Essential (primary) hypertension: Secondary | ICD-10-CM

## 2021-09-07 DIAGNOSIS — E088 Diabetes mellitus due to underlying condition with unspecified complications: Secondary | ICD-10-CM

## 2021-09-07 DIAGNOSIS — I25110 Atherosclerotic heart disease of native coronary artery with unstable angina pectoris: Secondary | ICD-10-CM | POA: Diagnosis not present

## 2021-09-07 DIAGNOSIS — E782 Mixed hyperlipidemia: Secondary | ICD-10-CM

## 2021-09-07 MED ORDER — ROSUVASTATIN CALCIUM 10 MG PO TABS: 10.0000 mg | ORAL_TABLET | Freq: Every day | ORAL | 3 refills | Status: DC

## 2021-09-07 MED ORDER — NITROGLYCERIN 0.4 MG SL SUBL: 0.4000 mg | SUBLINGUAL_TABLET | SUBLINGUAL | 6 refills | Status: DC | PRN

## 2021-09-07 NOTE — Addendum Note (Signed)
Addended by: Heywood Bene on: 09/07/2021 11:35 AM ? ? Modules accepted: Orders ? ?

## 2021-09-07 NOTE — Progress Notes (Signed)
?Cardiology Office Note:   ? ?Date:  09/07/2021  ? ?ID:  Marie Hughes, DOB 17-Apr-1956, MRN [id number] ? ?PCP:  Dorita Sciara, FNP  ?Cardiologist:  Jenean Lindau, MD  ? ?Referring MD: Dorita Sciara, FNP  ? ? ?ASSESSMENT:   ? ?1. Essential hypertension   ?2. Mixed hyperlipidemia   ?3. Coronary artery disease involving native coronary artery of native heart with unstable angina pectoris ([place])   ?4. Diabetes mellitus due to underlying condition with unspecified complications ([place])   ?5. Morbid obesity ([place])   ? ?PLAN:   ? ?In order of problems listed above: ? ?Coronary artery disease: Secondary prevention stressed with the patient.  Importance of compliance with diet medication stressed and she vocalized understanding.  She was advised to walk at least half an hour a day 5 days a week and she promises to do so. ?Essential hypertension: Blood pressure stable.  She has an element of whitecoat hypertension.  Her blood pressures are fine at home. ?Mixed dyslipidemia: She is fasting and will have complete blood work today.  She tells me that she cannot tolerate any more than 10 mg of Crestor and we will review her labs and advise her. ?Diabetes mellitus and obesity: She has diet-controlled diabetes mellitus and promises to do better with diet and exercise.  Risks of obesity explained and she promises to do better. ?Patient will be seen in follow-up appointment in 12 months or earlier if the patient has any concerns ? ? ? ?Medication Adjustments/Labs and Tests Ordered: ?Current medicines are reviewed at length with the patient today.  Concerns regarding medicines are outlined above.  ?No orders of the defined types were placed in this encounter. ? ?No orders of the defined types were placed in this encounter. ? ? ? ?No chief complaint on file. ?  ? ?History of Present Illness:   ? ?Marie Hughes is a 66 y.o. female.  Patient has past medical history of coronary artery disease, essential hypertension,  dyslipidemia, diabetes mellitus and morbid obesity.  She denies any problems at this time and takes care of activities of daily living.  No chest pain orthopnea or PND.  She leads a sedentary lifestyle.  She has not lost much weight.  At the time of my evaluation, the patient is alert awake oriented and in no distress. ? ?Past Medical History:  ?Diagnosis Date  ? Acute cystitis without hematuria 07/30/2019  ? Last Assessment & Plan:  Formatting of this note might be different from the original. UA most consistent with uncomplicated UTI. No red flag features to suggest need for imaging, special testing or urgent specialty consultation at this time. Antibiotic started per visit orders. Will send UA for culture to confirm sensitivity to abx given. Recommend f/u with PCP or return to clinic or ED for worse  ? Arthralgia of knee, left 05/17/2019  ? Last Assessment & Plan:  Formatting of this note might be different from the original. Recently provided intra-articular injection is done very well, could repeat in the future if persistent symptoms.  ? Bilateral carotid bruits 10/17/2019  ? Bladder problem   ? Cervical post-laminectomy syndrome 08/21/2017  ? Last Assessment & Plan:  Formatting of this note might be different from the original. Multilevel cervical fusion with continued neck and right upper extremity radicular pain.  Stable at this time without any progression, we will hold on any further imaging at this time.  ? Chest pain 12/21/2017  ? Last Assessment & Plan:  Intermittent. Non-radiating. No N/V/D. No vision changes.  Last Assessment & Plan:  Formatting of this note might be different from the original. No complaints of chest pain since hospital discharge.  ? Chronic foot pain, right 01/24/2020  ? Last Assessment & Plan:  Formatting of this note might be different from the original. 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  She is followed by Podiatry.  Today I recommended obtaining a more appropriate supportive footwear, we have also discu  ? Chronic left shoulder pain 06/19/2020  ? Last Assessment & Plan:  Formatting of this note might be different from the original. Recently referred to orthopedics by PCP, several weeks status post intra-articular injection with excellent benefit.  I did let patient know that I am happy to repeat that procedure in the future should she need it.  ? Chronic pain of right ankle 06/19/2020  ? Chronic right shoulder pain 11/12/2018  ? Last Assessment & Plan:  Formatting of this note might be different from the original. Today we will take plain films of the shoulder to further evaluate etiology of her pain.  Discussed with patient that it is very likely radicular in nature, as I do not have any findings on exam suspicious of true shoulder etiology.  ? Chronically on opiate therapy 12/20/2016  ? Last Assessment & Plan:  Risks of this type of medication including the risk of addiction and, if misused, of death are reviewed with the patient.  The patient feels the benefits of pain relief and increased ability to engage in self-care activities improve the quality of life to outweigh these risks.  Last Assessment & Plan:  Formatting of this note might be different from the original. Will cont  ? Class 2 drug-induced obesity with serious comorbidity and body mass index (BMI) of 39.0 to 39.9 in adult 12/21/2017  ? Last Assessment & Plan:  Formatting of this note might be different from the original. BMI Assessment: Current Body mass index is 39.91 kg/m?Marland Kitchen  Patient BMI currently is above average (>25 kg/m2); BMI follow up plan is completed . Current barriers to healthy weight management include none.  BMI Plan:  Today, Lilah and I have discussed methods to help address her current weight.   General weight l  ? Class 2 severe obesity due to excess calories  with serious comorbidity and body mass index (BMI) of 39.0 to 39.9 in adult [facility]) 06/19/2020  ? Last Assessment & Plan:  Formatting of this note might be different from the original. BMI Assessment: Current Body mass index is 39.68 kg/m?Marland Kitchen  Patient BMI currently is above average (>25 kg/m2); BMI follow up plan is completed . Current barriers to healthy weight management include pain.  BMI Plan:  Today, Aeriell and I have discussed methods to help address her current weight.   General weight l  ? Coronary artery disease involving native coronary artery of native heart with unstable angina pectoris ([place]) 09/12/2018  ? Last Assessment & Plan:  Formatting of this note might be different from the original. She is followed by cardiology and has an upcoming appointment with them.   Advised to continue current medications.  ? DDD (degenerative disc disease), lumbar 08/21/2017  ? Last Assessment & Plan:  Stble. No exacerbation of symptoms.  Last Assessment & Plan:  Formatting of this note might be different from the original. Chronic back  pain and bilateral lower extremity radicular symptoms that appear to correspond to L4/5 distribution. Review of most recent lumbar MRI from 2013 shows multilevel degenerative changes most prominent at L4-5 where there is mild spinal steno  ? Degenerative cervical spinal stenosis 01/05/2016  ? Last Assessment & Plan:  Formatting of this note might be different from the original. We will continue current medications.  Patient understands she may benefit percutaneous interventions.  Patient has had previous cervical spine surgeries.  ? Depression   ? Dietary counseling 04/17/2020  ? Dizziness 12/18/2020  ? Last Assessment & Plan:  Formatting of this note might be different from the original. Patient complains of dizziness and overall not feeling well.  She does complain of feelings of hot and cold as well as sweats.  She admits working at the [employer] is reunion in the direct heat.  She  did this for several days.  She feels she may be dehydrated somewhat.  She has no focal neuro def  ? Dyspnea on exertion 04/12/2020  ? Dysuria 07/24/2019  ? Last Assessment & Plan:  Formatting of this note might be

## 2021-09-07 NOTE — Addendum Note (Signed)
Addended by: Edwyna Shell I on: 09/07/2021 11:24 AM ? ? Modules accepted: Orders ? ?

## 2021-09-07 NOTE — Patient Instructions (Signed)
Medication Instructions:  ?Your physician has recommended you make the following change in your medication:  ? ?START: Crestor 10 mg daily ? ?*If you need a refill on your cardiac medications before your next appointment, please call your pharmacy* ? ? ?Lab Work: ?Your physician recommends that you return for lab work in:  ? ?Labs today: BMP, CBC, TSH, LFT, Lipid, HgbA1c, Vitamin D ? ?If you have labs (blood work) drawn today and your tests are completely normal, you will receive your results only by: ?MyChart Message (if you have MyChart) OR ?A paper copy in the mail ?If you have any lab test that is abnormal or we need to change your treatment, we will call you to review the results. ? ? ?Testing/Procedures: ?None ? ? ?Follow-Up: ?At [facility], you and your health needs are our priority.  As part of our continuing mission to provide you with exceptional heart care, we have created designated Provider Care Teams.  These Care Teams include your primary Cardiologist (physician) and Advanced Practice Providers (APPs -  Physician Assistants and Nurse Practitioners) who all work together to provide you with the care you need, when you need it. ? ?We recommend signing up for the patient portal called "MyChart".  Sign up information is provided on this After Visit Summary.  MyChart is used to connect with patients for Virtual Visits (Telemedicine).  Patients are able to view lab/test results, encounter notes, upcoming appointments, etc.  Non-urgent messages can be sent to your provider as well.   ?To learn more about what you can do with MyChart, go to [email or web address].   ? ?Your next appointment:   ?1 year(s) ? ?The format for your next appointment:   ?In Person ? ?Provider:   ?Belva Cromeajan Revankar, MD  ? ? ?Other Instructions ?None ? ?

## 2021-09-08 LAB — HEPATIC FUNCTION PANEL
ALT: 22 IU/L (ref 0–32)
AST: 19 IU/L (ref 0–40)
Albumin: 4.6 g/dL (ref 3.8–4.8)
Alkaline Phosphatase: 83 IU/L (ref 44–121)
Bilirubin Total: 0.3 mg/dL (ref 0.0–1.2)
Bilirubin, Direct: <0.1 mg/dL (ref 0.00–0.40)
Total Protein: 6.9 g/dL (ref 6.0–8.5)

## 2021-09-08 LAB — VITAMIN D 25 HYDROXY (VIT D DEFICIENCY, FRACTURES): Vit D, 25-Hydroxy: 31.8 ng/mL (ref 30.0–100.0)

## 2021-09-08 LAB — CBC
Hematocrit: 38.5 % (ref 34.0–46.6)
Hemoglobin: 13.2 g/dL (ref 11.1–15.9)
MCH: 29.6 pg (ref 26.6–33.0)
MCHC: 34.3 g/dL (ref 31.5–35.7)
MCV: 86 fL (ref 79–97)
Platelets: 241 10*3/uL (ref 150–450)
RBC: 4.46 x10E6/uL (ref 3.77–5.28)
RDW: 12.7 % (ref 11.7–15.4)
WBC: 5.2 10*3/uL (ref 3.4–10.8)

## 2021-09-08 LAB — BASIC METABOLIC PANEL
BUN/Creatinine Ratio: 22 (ref 12–28)
BUN: 16 mg/dL (ref 8–27)
CO2: 25 mmol/L (ref 20–29)
Calcium: 9.5 mg/dL (ref 8.7–10.3)
Chloride: 103 mmol/L (ref 96–106)
Creatinine, Ser: 0.73 mg/dL (ref 0.57–1.00)
Glucose: 104 mg/dL — ABNORMAL HIGH (ref 70–99)
Potassium: 4.6 mmol/L (ref 3.5–5.2)
Sodium: 140 mmol/L (ref 134–144)
eGFR: 91 mL/min/{1.73_m2} (ref >59)

## 2021-09-08 LAB — LIPID PANEL
Chol/HDL Ratio: 3.6 ratio (ref 0.0–4.4)
Cholesterol, Total: 175 mg/dL (ref 100–199)
HDL: 48 mg/dL (ref >39)
LDL Chol Calc (NIH): 92 mg/dL (ref 0–99)
Triglycerides: 204 mg/dL — ABNORMAL HIGH (ref 0–149)
VLDL Cholesterol Cal: 35 mg/dL (ref 5–40)

## 2021-09-08 LAB — TSH: TSH: 1.4 u[IU]/mL (ref 0.450–4.500)

## 2021-09-08 LAB — HEMOGLOBIN A1C
Est. average glucose Bld gHb Est-mCnc: 131 mg/dL
Hgb A1c MFr Bld: 6.2 % — ABNORMAL HIGH (ref 4.8–5.6)

## 2021-09-24 ENCOUNTER — Other Ambulatory Visit: Payer: Self-pay

## 2021-09-24 MED ORDER — ROSUVASTATIN CALCIUM 10 MG PO TABS: 10.0000 mg | ORAL_TABLET | Freq: Every day | ORAL | 2 refills | Status: DC

## 2021-09-24 NOTE — Telephone Encounter (Signed)
[facility] mail order pharmacy is requesting a new Rx for rosuvastatin 10 mg, be resent to their pharmacy. Please address ?

## 2021-10-11 DIAGNOSIS — Z780 Asymptomatic menopausal state: Secondary | ICD-10-CM | POA: Insufficient documentation

## 2021-10-11 HISTORY — DX: Asymptomatic menopausal state: Z78.0

## 2021-12-07 DIAGNOSIS — R252 Cramp and spasm: Secondary | ICD-10-CM | POA: Insufficient documentation

## 2021-12-07 HISTORY — DX: Cramp and spasm: R25.2

## 2022-04-12 DIAGNOSIS — Z8616 Personal history of COVID-19: Secondary | ICD-10-CM

## 2022-04-12 HISTORY — DX: Personal history of COVID-19: Z86.16

## 2022-07-14 DIAGNOSIS — N390 Urinary tract infection, site not specified: Secondary | ICD-10-CM | POA: Insufficient documentation

## 2022-07-14 DIAGNOSIS — M75120 Complete rotator cuff tear or rupture of unspecified shoulder, not specified as traumatic: Secondary | ICD-10-CM | POA: Insufficient documentation

## 2022-07-14 DIAGNOSIS — K5790 Diverticulosis of intestine, part unspecified, without perforation or abscess without bleeding: Secondary | ICD-10-CM

## 2022-07-14 DIAGNOSIS — N200 Calculus of kidney: Secondary | ICD-10-CM

## 2022-07-14 DIAGNOSIS — M722 Plantar fascial fibromatosis: Secondary | ICD-10-CM | POA: Insufficient documentation

## 2022-07-14 DIAGNOSIS — M7582 Other shoulder lesions, left shoulder: Secondary | ICD-10-CM | POA: Insufficient documentation

## 2022-07-14 DIAGNOSIS — I341 Nonrheumatic mitral (valve) prolapse: Secondary | ICD-10-CM | POA: Insufficient documentation

## 2022-07-14 DIAGNOSIS — R319 Hematuria, unspecified: Secondary | ICD-10-CM | POA: Insufficient documentation

## 2022-07-14 DIAGNOSIS — G8929 Other chronic pain: Secondary | ICD-10-CM | POA: Insufficient documentation

## 2022-07-14 DIAGNOSIS — R351 Nocturia: Secondary | ICD-10-CM

## 2022-07-14 DIAGNOSIS — M7542 Impingement syndrome of left shoulder: Secondary | ICD-10-CM

## 2022-07-14 DIAGNOSIS — R2241 Localized swelling, mass and lump, right lower limb: Secondary | ICD-10-CM | POA: Insufficient documentation

## 2022-07-14 HISTORY — DX: Other shoulder lesions, left shoulder: M75.82

## 2022-07-14 HISTORY — DX: Plantar fascial fibromatosis: M72.2

## 2022-07-14 HISTORY — DX: Nocturia: R35.1

## 2022-07-14 HISTORY — DX: Nonrheumatic mitral (valve) prolapse: I34.1

## 2022-07-14 HISTORY — DX: Complete rotator cuff tear or rupture of unspecified shoulder, not specified as traumatic: M75.120

## 2022-07-14 HISTORY — DX: Localized swelling, mass and lump, right lower limb: R22.41

## 2022-07-14 HISTORY — DX: Hematuria, unspecified: R31.9

## 2022-07-14 HISTORY — DX: Urinary tract infection, site not specified: N39.0

## 2022-07-14 HISTORY — DX: Impingement syndrome of left shoulder: M75.42

## 2022-07-14 HISTORY — DX: Calculus of kidney: N20.0

## 2022-07-14 HISTORY — DX: Diverticulosis of intestine, part unspecified, without perforation or abscess without bleeding: K57.90

## 2022-07-14 HISTORY — DX: Other chronic pain: G89.29

## 2022-11-01 DIAGNOSIS — R5383 Other fatigue: Secondary | ICD-10-CM | POA: Insufficient documentation

## 2022-11-01 DIAGNOSIS — M129 Arthropathy, unspecified: Secondary | ICD-10-CM

## 2022-11-01 HISTORY — DX: Other fatigue: R53.83

## 2022-11-01 HISTORY — DX: Arthropathy, unspecified: M12.9

## 2022-11-10 NOTE — Progress Notes (Signed)
Cardiology Office Note:    Date:  11/11/2022   ID:  Marie Hughes, DOB 1955-12-01, MRN [id number]  PCP:  Malva Limes, FNP    HeartCare Providers Cardiologist:  None     Referring MD: Malva Limes, FNP   CC: follow up CAD  History of Present Illness:    Marie Hughes is a 67 y.o. female with a hx of nonobstructive CAD, hypertension, hyperlipidemia, DM 2, obesity, LBBB, depression.  Echocardiogram on 05/28/2018 revealed an EF of 55 to 60%, mild MR, mild to moderate left atrial enlargement.  Left heart cath on 08/20/2018 revealed nonobstructive CAD.  She wore a monitor in June 2021, predominant underlying rhythm was sinus, unremarkable.  Most recently she was evaluated by Dr. Tomie China on 09/07/2021, she was doing well from a cardiac perspective and advised to follow-up in a year, sooner if needed.  She presents today for follow-up of her CAD.  States she has been doing well overall.  She is recently enrolled in healthy weight and wellness in [place] and will be meeting with them soon.  She does work out pretty routinely in [employer]  She states that at times she is bothered by her heart rate fluctuating, she will check it at the gym and it could be in the 50s or as high as 170.  She also endorses some DOE, this does not prohibit her from working out, she is not sure if it is related to deconditioning/obesity or something else. She denies chest pain, palpitations, pnd, orthopnea, n, v, dizziness, syncope, edema, weight gain, or early satiety.    Past Medical History:  Diagnosis Date   Acute cystitis without hematuria 07/30/2019   Last Assessment & Plan:  Formatting of this note might be different from the original. UA most consistent with uncomplicated UTI. No red flag features to suggest need for imaging, special testing or urgent specialty consultation at this time. Antibiotic started per visit orders. Will send UA for culture to confirm sensitivity to abx given.  Recommend f/u with PCP or return to clinic or ED for worse   Arthralgia of knee, left 05/17/2019   Last Assessment & Plan:  Formatting of this note might be different from the original. Recently provided intra-articular injection is done very well, could repeat in the future if persistent symptoms.   Bilateral carotid bruits 10/17/2019   Bladder problem    Cervical post-laminectomy syndrome 08/21/2017   Last Assessment & Plan:  Formatting of this note might be different from the original. Multilevel cervical fusion with continued neck and right upper extremity radicular pain.  Stable at this time without any progression, we will hold on any further imaging at this time.   Chest pain 12/21/2017   Last Assessment & Plan:  Intermittent. Non-radiating. No N/V/D. No vision changes.  Last Assessment & Plan:  Formatting of this note might be different from the original. No complaints of chest pain since hospital discharge.   Chronic foot pain, right 01/24/2020   Last Assessment & Plan:  Formatting of this note might be different from the original. 67 year old female with chronic pain of multiple etiologies, at this time most limiting complaint is right foot pain secondary to refractory plantar fasciitis status post surgical intervention.  She is followed by Podiatry.  Today I recommended obtaining a more appropriate supportive footwear, we have also discu   Chronic left shoulder pain 06/19/2020   Last Assessment & Plan:  Formatting of this note might be different from  the original. Recently referred to orthopedics by PCP, several weeks status post intra-articular injection with excellent benefit.  I did let patient know that I am happy to repeat that procedure in the future should she need it.   Chronic pain of right ankle 06/19/2020   Chronic right shoulder pain 11/12/2018   Last Assessment & Plan:  Formatting of this note might be different from the original. Today we will take plain films of the shoulder to further  evaluate etiology of her pain.  Discussed with patient that it is very likely radicular in nature, as I do not have any findings on exam suspicious of true shoulder etiology.   Chronically on opiate therapy 12/20/2016   Last Assessment & Plan:  Risks of this type of medication including the risk of addiction and, if misused, of death are reviewed with the patient.  The patient feels the benefits of pain relief and increased ability to engage in self-care activities improve the quality of life to outweigh these risks.  Last Assessment & Plan:  Formatting of this note might be different from the original. Will cont   Class 2 drug-induced obesity with serious comorbidity and body mass index (BMI) of 39.0 to 39.9 in adult 12/21/2017   Last Assessment & Plan:  Formatting of this note might be different from the original. BMI Assessment: Current Body mass index is 39.91 kg/m.  Patient BMI currently is above average (>25 kg/m2); BMI follow up plan is completed . Current barriers to healthy weight management include none.  BMI Plan:  Today, [name] and I have discussed methods to help address her current weight.   General weight l   Class 2 severe obesity due to excess calories with serious comorbidity and body mass index (BMI) of 39.0 to 39.9 in adult [facility]) 06/19/2020   Last Assessment & Plan:  Formatting of this note might be different from the original. BMI Assessment: Current Body mass index is 39.68 kg/m.  Patient BMI currently is above average (>25 kg/m2); BMI follow up plan is completed . Current barriers to healthy weight management include pain.  BMI Plan:  Today, [name] and I have discussed methods to help address her current weight.   General weight l   Coronary artery disease involving native coronary artery of native heart with unstable angina pectoris (HCC) 09/12/2018   Last Assessment & Plan:  Formatting of this note might be different from the original. She is followed by cardiology and has an upcoming  appointment with them.   Advised to continue current medications.   DDD (degenerative disc disease), lumbar 08/21/2017   Last Assessment & Plan:  Stble. No exacerbation of symptoms.  Last Assessment & Plan:  Formatting of this note might be different from the original. Chronic back pain and bilateral lower extremity radicular symptoms that appear to correspond to L4/5 distribution. Review of most recent lumbar MRI from 2013 shows multilevel degenerative changes most prominent at L4-5 where there is mild spinal steno   Degenerative cervical spinal stenosis 01/05/2016   Last Assessment & Plan:  Formatting of this note might be different from the original. We will continue current medications.  Patient understands she may benefit percutaneous interventions.  Patient has had previous cervical spine surgeries.   Depression    Dietary counseling 04/17/2020   Dizziness 12/18/2020   Last Assessment & Plan:  Formatting of this note might be different from the original. Patient complains of dizziness and overall not feeling well.  She does complain  of feelings of hot and cold as well as sweats.  She admits working at the [employer] is reunion in the direct heat.  She did this for several days.  She feels she may be dehydrated somewhat.  She has no focal neuro def   Dyspnea on exertion 04/12/2020   Dysuria 07/24/2019   Last Assessment & Plan:  Formatting of this note might be different from the original. Assessment  Pain intermittent in the suprapubic. Mild backpain. Symptoms "come and go", then come back. Hx of kidney stone.  Reports weak urine stream Afebrile, no chills This may be a recurrence of kidney stone UA +wbc 75, will culture  Plan  Will start Flomax and await the urine culture   Encounter for screening mammogram for breast cancer 12/18/2019   Essential hypertension 01/05/2016   Last Assessment & Plan:  Formatting of this note might be different from the original. Blood pressure elevated  today-follow-up with primary care for further management.   Exposure to 2019 novel coronavirus 07/01/2019   Last Assessment & Plan:  Formatting of this note might be different from the original. POCT Sars-Cov-2 Sophia NEGATIVE POCT Sars-COV-2 Aptima-COV  I have explained to the patient they must quarantine according to [facility] current guideline. They are encouraged to protect others in the household by wearing a mask, covering cough, and cleansing any area used by the patient, and if possible quarantine in    Heart disease 12/21/2017   Last Assessment & Plan:  No reports of chest pain in approximately 8 months. Will refill Nitro SL tabs.  Last Assessment & Plan:  Formatting of this note might be different from the original. Complaining of more frequent episodes of dyspnea on exertion, and bilateral leg swelling which are chronic concerns for her.  Advise she follow-up with her PCP in regards to these concerns for further evaluat   Hypertension    Hypertensive urgency 01/05/2016   Last Assessment & Plan:  Formatting of this note might be different from the original. Blood pressure is controlled today. 128/70. Advise to continue monitoring. Notify office for elevation. Continue current medications and f/u with cardiology.   IFG (impaired fasting glucose) 12/21/2017   Last Assessment & Plan:  Monitoring with blood work. POC according to results.'  Last Assessment & Plan:  Formatting of this note is different from the original. Lab Results  Component Value Date   HGBA1C 6.2 12/21/2017   Prediabetic. Monitoring with BW  Monitoring with blood work  Change in diet including a whole plant based diet, limiting meats especially red meat encouraged.   Avoiding unhea   Left bundle branch block (LBBB) 10/17/2019   Formatting of this note might be different from the original. Noted to have LBBB in old records. LBBB mentioned in cardiologist's office note 10/17/19.  Last Assessment & Plan:  Formatting of this note might be  different from the original. This is chronic and was noted on recent stress test. She is following with cardiology. Advised to proceed with next appointment.  She is asymptomatic today.   Long term use of drug 12/18/2019   Lumbar radiculitis 01/05/2016   Last Assessment & Plan:  Formatting of this note might be different from the original. Patient will continue with current medications.  Patient understands she may benefit percutaneous interventions.   Mixed hyperlipidemia 01/05/2016   Last Assessment & Plan:  Formatting of this note is different from the original. Problem Complexity:  Chronic problem/illness, stable  Pertinent Data: LDL  Calculated (mg/dL)  Date Value  16/03/9603 48   Cholesterol (mg/dl)  Date Value  54/02/8118 135   HDL (mg/dl)  Date Value  14/78/2956 34.4 (L)   Triglycerides (mg/dl)  Date Value  21/30/8657 261 (H)   AST (U/L)  Date Value  12/18/2019 20   ALT (   Morbid obesity (HCC) 04/16/2018   Muscle pain    Muscle spasm of back 08/21/2017   Last Assessment & Plan:  Formatting of this note might be different from the original. Continue p.r.n. Soma   Nausea 12/18/2020   Obesity due to excess calories 01/05/2016   Last Assessment & Plan:  Formatting of this note might be different from the original. Diet was discussed and reviewed with the patient. Modify diet to eliminate bad fats (fast food, junk foods) and increase fiber intake. Regular exercise several times per week as appropriate, along with proper diet will help to improve overall health. Discussion concerning the health risk of obesity educated. Edu   Otalgia, left ear 12/18/2019   Last Assessment & Plan:  Formatting of this note might be different from the original. Physical examination revealed a normal exam.  No evidence of infection.  No evidence of cerumen impaction.   Other chronic pain 08/21/2017   Last Assessment & Plan:  ENCARNACION BURRI is a 66 y.o.  year old female with Low back pain, Neck pain, due to Multilevel  multifactorial degenerative changes in the lumbar and cervical spine, resulting in lower extremity radiculopathy bilaterally, in the setting of previous surgery in the cervical spine. Today I will refill the patient's current medications of Hydrocodone 10/325 mg PO PRN qid, and S   Pain and swelling of lower extremity, right 11/12/2018   Last Assessment & Plan:  Formatting of this note might be different from the original. Due to patient's recent onset of worsening lower extremity pain, with increased unilateral swelling and diffuse calf tenderness of the right extremity, we recommend she go for a right lower extremity ultrasound to rule out a DVT.   Pain medication agreement 08/10/2018   Last Assessment & Plan:  There is a current opioid agreement on file within the clinic and urine drug screens will be collected as needed to be in compliance with clinic policies.  Last Assessment & Plan:  Formatting of this note might be different from the original. Review of prior UDS results are consistent with regimen.  [place] controlled substance registry reviewed and is consistent wi   Pre-syncope 04/12/2020   Last Assessment & Plan:  Formatting of this note might be different from the original. No further episodes since hospital discharge.   Recurrent cystitis 12/18/2019   Recurrent major depressive disorder, in partial remission (HCC) 01/05/2016   Last Assessment & Plan:  Formatting of this note might be different from the original. This is a chronic problem and is stable.  She denies suicidal or homicidal ideations.  Advised to continue current medications.   Suspected 2019 novel coronavirus infection 12/18/2020   Swelling    Thoracic radiculopathy 02/11/2019   Last Assessment & Plan:  Formatting of this note might be different from the original. Plain films of the thoracic spine completed after last appointment show ACDF with mid lower cervical spine with thoracic vertebral body alignment and disc spaces  normal.  Responded well to previous oral steroids.  Have discussed possibility of C7-T1 CESI should her symptoms return.   Type 2 diabetes mellitus without complication, without long-term current use of insulin (  HCC) 12/17/2020   Last Assessment & Plan:  Formatting of this note might be different from the original. Encourage her today to focus on losing weight, decreasing carbohydrate intake.  Hopefully she can make some improvements here to get this better controlled.   Uncomplicated opioid dependence (HCC) 01/05/2016   Viral syndrome 12/18/2020   Vitamin D deficiency 12/18/2019    Past Surgical History:  Procedure Laterality Date   ABDOMINAL HYSTERECTOMY     FOOT SURGERY     HAND SURGERY     BOTH HANDS   LEFT HEART CATH AND CORONARY ANGIOGRAPHY N/A 08/20/2018   Procedure: LEFT HEART CATH AND CORONARY ANGIOGRAPHY;  Surgeon: Corky Crafts, MD;  Location: [facility] INVASIVE CV LAB;  Service: Cardiovascular;  Laterality: N/A;   NECK SURGERIES      Current Medications: Current Meds  Medication Sig   amitriptyline (ELAVIL) 10 MG tablet Take 10 mg by mouth at bedtime.   amLODipine (NORVASC) 10 MG tablet Take 10 mg by mouth every evening.    aspirin EC 81 MG tablet Take 81 mg by mouth daily.   carisoprodol (SOMA) 350 MG tablet Take 350 mg by mouth daily as needed for muscle spasms.    carvedilol (COREG) 25 MG tablet Take 1 tablet (25 mg total) by mouth 2 (two) times daily with a meal.   Dietary Management Product (VASCULERA) TABS Take 630 mg by mouth every morning.   HYDROcodone-acetaminophen (NORCO) 10-325 MG per tablet Take 1 tablet by mouth every 6 (six) hours as needed for moderate pain.   KLOR-CON M20 20 MEQ tablet Take 20 mEq by mouth daily at 3 pm.    lisinopril-hydrochlorothiazide (PRINZIDE,ZESTORETIC) 20-12.5 MG per tablet Take 1 tablet by mouth daily.   Multiple Vitamins-Minerals (ADULT GUMMY PO) Take 1 tablet by mouth 2 (two) times a week.   nitroGLYCERIN (NITROSTAT) 0.4 MG SL tablet  Place 1 tablet (0.4 mg total) under the tongue every 5 (five) minutes as needed for chest pain.   OVER THE COUNTER MEDICATION Take 1 tablet by mouth daily. B12, Calcium and D3   rosuvastatin (CRESTOR) 10 MG tablet Take 10 mg by mouth 2 (two) times daily.     Allergies:   Nitrofurantoin   Social History   Socioeconomic History   Marital status: Widowed    Spouse name: Not on file   Number of children: Not on file   Years of education: Not on file   Highest education level: Not on file  Occupational History   Not on file  Tobacco Use   Smoking status: Former    Types: Cigarettes    Quit date: 2008    Years since quitting: 16.4   Smokeless tobacco: Never  Substance and Sexual Activity   Alcohol use: No    Alcohol/week: 0.0 standard drinks of alcohol   Drug use: No   Sexual activity: Not on file  Other Topics Concern   Not on file  Social History Narrative   Not on file   Social Determinants of Health   Financial Resource Strain: Not on file  Food Insecurity: Not on file  Transportation Needs: Not on file  Physical Activity: Not on file  Stress: Not on file  Social Connections: Not on file     Family History: The patient's family history includes Coronary artery disease in her father; Heart attack in her mother and paternal grandmother; Other in her brother and father; Pneumonia in her mother.  ROS:   Please see the history of  present illness.     All other systems reviewed and are negative.  EKGs/Labs/Other Studies Reviewed:    The following studies were reviewed today: Cardiac Studies & Procedures   CARDIAC CATHETERIZATION  CARDIAC CATHETERIZATION 08/20/2018  Narrative  Prox RCA lesion is 25% stenosed.  Mid Cx lesion is 40% stenosed.  Prox LAD lesion is 25% stenosed.  Ost 1st Diag lesion is 50% stenosed.  LV end diastolic pressure is moderately elevated.  The left ventricular ejection fraction is 50-55% by visual estimate.  The left ventricular  systolic function is normal.  There is no aortic valve stenosis.  Nonobstructive CAD.  Moderately increased LVEDP.  Consider diuretic to help with shortness of breath.  Continue with aggressive risk factor modification including weight loss.  Findings Coronary Findings Diagnostic  Dominance: Right  Left Anterior Descending Prox LAD lesion is 25% stenosed.  First Diagonal Branch Ost 1st Diag lesion is 50% stenosed.  Left Circumflex Vessel is small. Mid Cx lesion is 40% stenosed.  Right Coronary Artery Prox RCA lesion is 25% stenosed.  Intervention  No interventions have been documented.   STRESS TESTS  MYOCARDIAL PERFUSION IMAGING 05/18/2018  Narrative  The left ventricular ejection fraction is normal (55-65%).  Nuclear stress EF: 58%.  There was no ST segment deviation noted during stress.  The study is normal.  This is a low risk study.  Thinning of the anteroseptum at mid and apical level likely from LBBB No ischemia EF 58% with no RWMAls low risk study   ECHOCARDIOGRAM  ECHOCARDIOGRAM COMPLETE 05/28/2018  Narrative *[facility]* [address] [place], [place] [address] [phone]  ------------------------------------------------------------------- Transthoracic Echocardiography  Patient:    Peachie, Hegland MR #:       [id number] Study Date: 05/28/2018 Gender:     F Age:        46 Height:     154.9 cm Weight:     94.8 kg BSA:        2.07 m^2 Pt. Status: Room:  [place], Kathie Rhodes ATTENDING    Belva Crome, MD ORDERING     Belva Crome, MD REFERRING    Belva Crome, MD SONOGRAPHER  Lanae Crumbly, RDCS PERFORMING   [facility] [place]  cc:  ------------------------------------------------------------------- LV EF: 55% -   60%  ------------------------------------------------------------------- Indications:      Chest pain 786.51.  ------------------------------------------------------------------- History:    PMH:   Dyspnea.  Risk factors:  Hypertension. Dyslipidemia.  ------------------------------------------------------------------- Study Conclusions  - Left ventricle: The cavity size was normal. Wall thickness was normal. Systolic function was normal. The estimated ejection fraction was in the range of 55% to 60%. Wall motion was normal; there were no regional wall motion abnormalities. - Mitral valve: There was mild regurgitation. - Left atrium: The atrium was mildly to moderately dilated.  Impressions:  - 1. Preserved left ventricular systolic function. 2. Mild to moderate left atrial enlargement. 3. Mild mitral regurgitation.  ------------------------------------------------------------------- Study data:  No prior study was available for comparison.  Study status:  Routine.  Procedure:  The patient reported no pain pre or post test. Transthoracic echocardiography. Image quality was adequate.  Study completion:  There were no complications. Transthoracic echocardiography.  M-mode, complete 2D, spectral Doppler, and color Doppler.  Birthdate:  Patient birthdate: [birth year].  Age:  Patient is 67 yr old.  Sex:  Gender: female. BMI: 39.5 kg/m^2.  Blood pressure:     122/82  Patient status: Outpatient.  Study date:  Study date: 05/28/2018.  Study time: 03:28 PM.  Location:  [facility] laboratory.  -------------------------------------------------------------------  ------------------------------------------------------------------- Left ventricle:  The cavity size was normal. Wall thickness was normal. Systolic function was normal. The estimated ejection fraction was in the range of 55% to 60%. Wall motion was normal; there were no regional wall motion abnormalities.  ------------------------------------------------------------------- Aortic valve:   Trileaflet; normal thickness leaflets. Mobility was not restricted.  Doppler:  Transvalvular velocity was within the normal range.  There was no stenosis. There was no regurgitation.  ------------------------------------------------------------------- Aorta:  Aortic root: The aortic root was normal in size.  ------------------------------------------------------------------- Mitral valve:   Structurally normal valve.   Mobility was not restricted.  Doppler:  Transvalvular velocity was within the normal range. There was no evidence for stenosis. There was mild regurgitation.    Valve area by pressure half-time: 3.01 cm^2. Indexed valve area by pressure half-time: 1.45 cm^2/m^2.    Peak gradient (D): 5 mm Hg.  ------------------------------------------------------------------- Left atrium:  The atrium was mildly to moderately dilated.  ------------------------------------------------------------------- Right ventricle:  The cavity size was normal. Wall thickness was normal. Systolic function was normal.  ------------------------------------------------------------------- Pulmonic valve:    Doppler:  Transvalvular velocity was within the normal range. There was no evidence for stenosis.  ------------------------------------------------------------------- Tricuspid valve:   Structurally normal valve.    Doppler: Transvalvular velocity was within the normal range. There was no regurgitation.  ------------------------------------------------------------------- Pulmonary artery:   The main pulmonary artery was normal-sized. Systolic pressure was within the normal range.  ------------------------------------------------------------------- Right atrium:  The atrium was normal in size.  ------------------------------------------------------------------- Pericardium:  There was no pericardial effusion.  ------------------------------------------------------------------- Systemic veins: Inferior vena cava: The vessel was normal in  size.  ------------------------------------------------------------------- Measurements  Left ventricle                         Value          Reference LV ID, ED, PLAX chordal                49    mm       43 - 52 LV ID, ES, PLAX chordal                36    mm       23 - 38 LV fx shortening, PLAX chordal (L)     27    %        >=29 LV PW thickness, ED                    10    mm       ---------- IVS/LV PW ratio, ED                    1.2            <=1.3 Stroke volume, 2D                      68    ml       ---------- Stroke volume/bsa, 2D                  33    ml/m^2   ---------- LV e&', lateral                         10.1  cm/s     ---------- LV E/e&', lateral  11.09          ---------- LV e&', medial                          5.44  cm/s     ---------- LV E/e&', medial                        20.59          ---------- LV e&', average                         7.77  cm/s     ---------- LV E/e&', average                       14.41          ----------  Ventricular septum                     Value          Reference IVS thickness, ED                      12    mm       ----------  LVOT                                   Value          Reference LVOT ID, S                             18    mm       ---------- LVOT area                              2.54  cm^2     ---------- LVOT peak velocity, S                  99.8  cm/s     ---------- LVOT mean velocity, S                  76.5  cm/s     ---------- LVOT VTI, S                            26.9  cm       ---------- LVOT peak gradient, S                  4     mm Hg    ----------  Aorta                                  Value          Reference Aortic root ID, ED                     30    mm       ---------- Ascending aorta ID, A-P, S             35    mm       ----------  Left atrium  Value          Reference LA ID, A-P, ES                         47    mm       ---------- LA ID/bsa, A-P                  (H)     2.27  cm/m^2   <=2.2 LA volume, S                           68.2  ml       ---------- LA volume/bsa, S                       32.9  ml/m^2   ---------- LA volume, ES, 1-p A4C                 70.8  ml       ---------- LA volume/bsa, ES, 1-p A4C             34.2  ml/m^2   ---------- LA volume, ES, 1-p A2C                 55.2  ml       ---------- LA volume/bsa, ES, 1-p A2C             26.6  ml/m^2   ----------  Mitral valve                           Value          Reference Mitral E-wave peak velocity            112   cm/s     ---------- Mitral A-wave peak velocity            92.2  cm/s     ---------- Mitral deceleration time       (H)     248   ms       150 - 230 Mitral pressure half-time              73    ms       ---------- Mitral peak gradient, D                5     mm Hg    ---------- Mitral E/A ratio, peak                 1.2            ---------- Mitral valve area, PHT, DP             3.01  cm^2     ---------- Mitral valve area/bsa, PHT, DP         1.45  cm^2/m^2 ----------  Pulmonary arteries                     Value          Reference PA pressure, S, DP                     25    mm Hg    <=30  Tricuspid valve  Value          Reference Tricuspid regurg peak velocity         236   cm/s     ---------- Tricuspid peak RV-RA gradient          22    mm Hg    ----------  Right atrium                           Value          Reference RA ID, S-I, ES, A4C            (H)     51.8  mm       34 - 49 RA area, ES, A4C                       15.4  cm^2     8.3 - 19.5 RA volume, ES, A/L                     38.8  ml       ---------- RA volume/bsa, ES, A/L                 18.7  ml/m^2   ----------  Systemic veins                         Value          Reference Estimated CVP                          3     mm Hg    ----------  Right ventricle                        Value          Reference TAPSE                                  21.3  mm        ---------- RV pressure, S, DP                     25    mm Hg    <=30 RV s&', lateral, S                      10.8  cm/s     ----------  Legend: (L)  and  (H)  mark values outside specified reference range.  ------------------------------------------------------------------- Prepared and Electronically Authenticated by  Belva Crome, MD 2019-12-16T18:27:17    MONITORS  LONG TERM MONITOR (3-14 DAYS) 11/22/2019  Narrative Fiore Kutzke, DOB 06/06/1956, MRN [id number]  EVENT MONITOR REPORT:   Patient was monitored from 10/17/2019 to 11/02/2019. Indication:                    Syncope and collapse Ordering physician:  Garwin Brothers, MD Referring physician:  Garwin Brothers, MD   Baseline rhythm: Sinus  Minimum heart rate: 38 BPM.  Average heart rate: 66 BPM.  Maximal heart rate 108 BPM.  Atrial arrhythmia: None significant, rare atrial runs longest lasting 15 seconds  Ventricular arrhythmia: None significant occasional PVCs  Conduction abnormality: Intraventricular conduction delay.  Block PACs seen at 5:37 AM in  the morning on 1 occasion  Symptoms: None significant   Conclusion: Mildly abnormal but overall unremarkable event monitoring.  Interpreting  cardiologist: Garwin Brothers, MD Date: 11/22/2019 11:51 AM            EKG: Most recent EKG was obtained on 06/29/2022, this revealed sinus bradycardia, interventricular conduction delay, consistent with prior EKG tracings, heart rate 58 bpm.  Recent Labs: No results found for requested labs within last 365 days.  Recent Lipid Panel    Component Value Date/Time   CHOL 175 09/07/2021 1142   TRIG 204 (H) 09/07/2021 1142   HDL 48 09/07/2021 1142   CHOLHDL 3.6 09/07/2021 1142   LDLCALC 92 09/07/2021 1142     Risk Assessment/Calculations:                Physical Exam:    VS:  BP 120/80 (BP Location: Right Arm, Patient Position: Sitting, Cuff Size: Normal)   Pulse (!) 56   Ht 5' 1.5" (1.562 m)   Wt 216  lb (98 kg)   SpO2 97%   BMI 40.15 kg/m     Wt Readings from Last 3 Encounters:  11/11/22 216 lb (98 kg)  09/07/21 213 lb (96.6 kg)  02/03/21 213 lb 9.6 oz (96.9 kg)     GEN:  Well nourished, well developed in no acute distress HEENT: Normal NECK: No JVD; No carotid bruits LYMPHATICS: No lymphadenopathy CARDIAC: RRR, 2/6 systolic murmur, rubs, gallops RESPIRATORY:  Clear to auscultation without rales, wheezing or rhonchi  ABDOMEN: Soft, non-tender, non-distended MUSCULOSKELETAL:  No edema; No deformity  SKIN: Warm and dry NEUROLOGIC:  Alert and oriented x 3 PSYCHIATRIC:  Normal affect   ASSESSMENT:    1. Shortness of breath   2. Palpitations   3. Mixed hyperlipidemia   4. Coronary artery disease involving native coronary artery of native heart with unstable angina pectoris (HCC)   5. Essential hypertension    PLAN:    In order of problems listed above:  Shortness of breath-she is able to work out without incident however feels that she is short of breath at rest.  Likely multifactorial, she has recently began to workout again and has joined a healthy weight and wellness center in [place].  Most recent echo in 2019 revealed EF 55 to 60%, mild MR.  Will update her echocardiogram. Palpitations-recent lab work was reviewed including TSH, CBC, BMET within the last 2 weeks and was WNL.  Will arrange a 7-day ZIO. Coronary artery disease-nonobstructive per LHC in 2020. Stable with no anginal symptoms. No indication for ischemic evaluation.  Continue aspirin 81 mg daily, continue Coreg 25 mg twice daily, continue nitroglycerin as needed--has not needed, continue Crestor 20 mg daily. Hypertension-blood pressure is well-controlled at 120/80, continue Norvasc 10 mg daily, continue carvedilol 25 mg twice daily, continue lisinopril-HCTZ 20-12.5 mg daily. Hyperlipidemia-most recent lipid profile by her PCP revealed LDL of 81, triglycerides of 226, her Crestor was increased to 20 mg daily.   Currently managed by her PCP.  Disposition-7-day ZIO, repeat echocardiogram , 6 weeks.       Medication Adjustments/Labs and Tests Ordered: Current medicines are reviewed at length with the patient today.  Concerns regarding medicines are outlined above.  Orders Placed This Encounter  Procedures   LONG TERM MONITOR (3-14 DAYS)   EKG 12-Lead   ECHOCARDIOGRAM COMPLETE   No orders of the defined types were placed in this encounter.   Patient Instructions  Medication Instructions:  Your physician recommends that  you continue on your current medications as directed. Please refer to the Current Medication list given to you today.  *If you need a refill on your cardiac medications before your next appointment, please call your pharmacy*   Lab Work: None Ordered If you have labs (blood work) drawn today and your tests are completely normal, you will receive your results only by: MyChart Message (if you have MyChart) OR A paper copy in the mail If you have any lab test that is abnormal or we need to change your treatment, we will call you to review the results.   Testing/Procedures:  Your physician has requested that you have an echocardiogram. Echocardiography is a painless test that uses sound waves to create images of your heart. It provides your doctor with information about the size and shape of your heart and how well your heart's chambers and valves are working. This procedure takes approximately one hour. There are no restrictions for this procedure. Please do NOT wear cologne, perfume, aftershave, or lotions (deodorant is allowed). Please arrive 15 minutes prior to your appointment time.   WHY IS MY DOCTOR PRESCRIBING ZIO? The Zio system is proven and trusted by physicians to detect and diagnose irregular heart rhythms -- and has been prescribed to hundreds of thousands of patients.  The FDA has cleared the Zio system to monitor for many different kinds of irregular heart  rhythms. In a study, physicians were able to reach a diagnosis 90% of the time with the Zio system1.  You can wear the Zio monitor -- a small, discreet, comfortable patch -- during your normal day-to-day activity, including while you sleep, shower, and exercise, while it records every single heartbeat for analysis.  1Barrett, P., et al. Comparison of 24 Hour Holter Monitoring Versus 14 Day Novel Adhesive Patch Electrocardiographic Monitoring. American Journal of Medicine, 2014.  ZIO VS. HOLTER MONITORING The Zio monitor can be comfortably worn for up to 14 days. Holter monitors can be worn for 24 to 48 hours, limiting the time to record any irregular heart rhythms you may have. Zio is able to capture data for the 51% of patients who have their first symptom-triggered arrhythmia after 48 hours.1  LIVE WITHOUT RESTRICTIONS The Zio ambulatory cardiac monitor is a small, unobtrusive, and water-resistant patch--you might even forget you're wearing it. The Zio monitor records and stores every beat of your heart, whether you're sleeping, working out, or showering.     Follow-Up: At [facility], you and your health needs are our priority.  As part of our continuing mission to provide you with exceptional heart care, we have created designated Provider Care Teams.  These Care Teams include your primary Cardiologist (physician) and Advanced Practice Providers (APPs -  Physician Assistants and Nurse Practitioners) who all work together to provide you with the care you need, when you need it.  We recommend signing up for the patient portal called "MyChart".  Sign up information is provided on this After Visit Summary.  MyChart is used to connect with patients for Virtual Visits (Telemedicine).  Patients are able to view lab/test results, encounter notes, upcoming appointments, etc.  Non-urgent messages can be sent to your provider as well.   To learn more about what you can do with MyChart, go to  [email or web address].    Your next appointment:   6 week(s)  The format for your next appointment:   In Person  Provider:   Wallis Bamberg, NP    Other Instructions  Signed, Flossie Dibble, NP  11/11/2022 3:58 PM    [facility]

## 2022-11-11 ENCOUNTER — Ambulatory Visit (INDEPENDENT_AMBULATORY_CARE_PROVIDER_SITE_OTHER): Payer: Medicare PPO

## 2022-11-11 ENCOUNTER — Encounter: Payer: Self-pay | Admitting: Cardiology

## 2022-11-11 ENCOUNTER — Ambulatory Visit: Payer: Medicare PPO | Attending: Cardiology | Admitting: Cardiology

## 2022-11-11 VITALS — BP 120/80 | HR 56 | Ht 61.5 in | Wt 216.0 lb

## 2022-11-11 DIAGNOSIS — R002 Palpitations: Secondary | ICD-10-CM

## 2022-11-11 DIAGNOSIS — E782 Mixed hyperlipidemia: Secondary | ICD-10-CM

## 2022-11-11 DIAGNOSIS — I25110 Atherosclerotic heart disease of native coronary artery with unstable angina pectoris: Secondary | ICD-10-CM

## 2022-11-11 DIAGNOSIS — R0602 Shortness of breath: Secondary | ICD-10-CM | POA: Diagnosis not present

## 2022-11-11 DIAGNOSIS — I10 Essential (primary) hypertension: Secondary | ICD-10-CM

## 2022-11-11 NOTE — Patient Instructions (Addendum)
Medication Instructions:  Your physician recommends that you continue on your current medications as directed. Please refer to the Current Medication list given to you today.  *If you need a refill on your cardiac medications before your next appointment, please call your pharmacy*   Lab Work: None Ordered If you have labs (blood work) drawn today and your tests are completely normal, you will receive your results only by: MyChart Message (if you have MyChart) OR A paper copy in the mail If you have any lab test that is abnormal or we need to change your treatment, we will call you to review the results.   Testing/Procedures:  Your physician has requested that you have an echocardiogram. Echocardiography is a painless test that uses sound waves to create images of your heart. It provides your doctor with information about the size and shape of your heart and how well your heart's chambers and valves are working. This procedure takes approximately one hour. There are no restrictions for this procedure. Please do NOT wear cologne, perfume, aftershave, or lotions (deodorant is allowed). Please arrive 15 minutes prior to your appointment time.   WHY IS MY DOCTOR PRESCRIBING ZIO? The Zio system is proven and trusted by physicians to detect and diagnose irregular heart rhythms -- and has been prescribed to hundreds of thousands of patients.  The FDA has cleared the Zio system to monitor for many different kinds of irregular heart rhythms. In a study, physicians were able to reach a diagnosis 90% of the time with the Zio system1.  You can wear the Zio monitor -- a small, discreet, comfortable patch -- during your normal day-to-day activity, including while you sleep, shower, and exercise, while it records every single heartbeat for analysis.  1Barrett, P., et al. Comparison of 24 Hour Holter Monitoring Versus 14 Day Novel Adhesive Patch Electrocardiographic Monitoring. American Journal of  Medicine, 2014.  ZIO VS. HOLTER MONITORING The Zio monitor can be comfortably worn for up to 14 days. Holter monitors can be worn for 24 to 48 hours, limiting the time to record any irregular heart rhythms you may have. Zio is able to capture data for the 51% of patients who have their first symptom-triggered arrhythmia after 48 hours.1  LIVE WITHOUT RESTRICTIONS The Zio ambulatory cardiac monitor is a small, unobtrusive, and water-resistant patch--you might even forget you're wearing it. The Zio monitor records and stores every beat of your heart, whether you're sleeping, working out, or showering.     Follow-Up: At [facility], you and your health needs are our priority.  As part of our continuing mission to provide you with exceptional heart care, we have created designated Provider Care Teams.  These Care Teams include your primary Cardiologist (physician) and Advanced Practice Providers (APPs -  Physician Assistants and Nurse Practitioners) who all work together to provide you with the care you need, when you need it.  We recommend signing up for the patient portal called "MyChart".  Sign up information is provided on this After Visit Summary.  MyChart is used to connect with patients for Virtual Visits (Telemedicine).  Patients are able to view lab/test results, encounter notes, upcoming appointments, etc.  Non-urgent messages can be sent to your provider as well.   To learn more about what you can do with MyChart, go to [email or web address].    Your next appointment:   6 week(s)  The format for your next appointment:   In Person  Provider:   Wallis Bamberg, NP  Other Instructions

## 2022-12-06 ENCOUNTER — Ambulatory Visit: Payer: Medicare PPO | Attending: Cardiology

## 2022-12-06 DIAGNOSIS — R0602 Shortness of breath: Secondary | ICD-10-CM

## 2022-12-06 LAB — ECHOCARDIOGRAM COMPLETE
AR max vel: 2.22 cm2
AV Area VTI: 2.38 cm2
AV Area mean vel: 2.22 cm2
AV Mean grad: 6.3 mmHg
AV Peak grad: 12 mmHg
Ao pk vel: 1.73 m/s
Area-P 1/2: 3.53 cm2
MV M vel: 3.04 m/s
MV Peak grad: 37 mmHg
S' Lateral: 3.5 cm

## 2022-12-22 NOTE — Progress Notes (Signed)
Cardiology Office Note:    Date:  12/23/2022   ID:  Marie Hughes, DOB [birth year], MRN [id number]  PCP:  Malva Limes, FNP   [facility] Providers Cardiologist:  Garwin Brothers, MD     Referring MD: Malva Limes, FNP   CC: follow up CAD  History of Present Illness:    Marie Hughes is a 68 y.o. female with a hx of nonobstructive CAD, hypertension, hyperlipidemia, DM 2, obesity, LBBB, depression.  Echocardiogram on 05/28/2018 revealed an EF of 55 to 60%, mild MR, mild to moderate left atrial enlargement. Left heart cath on 08/20/2018 revealed nonobstructive CAD. She wore a monitor in June 2021, predominant underlying rhythm was sinus, unremarkable. Echo 12/06/2022 EF 55 to 60%, mild concentric LVH, grade 1 DD, mild MR, mild dilatation of ascending aorta 38 mm 7-day ZIO 12/01/2022 average heart rate 65 bpm predominant I rhythm was sinus BBB/IVCD was present, 2 runs of SVT, isolated VE's  Most recently she was evaluated for follow-up of her CAD, was doing well, recently enrolled in a healthy weight and wellness program and was working out routinely at the gym.  She was bothered by her heart rate fluctuating and some palpitations also DOE.  We repeated an echocardiogram which revealed an EF of 55 to 60%, mild concentric LVH, grade 1 DD.  7-day ZIO was arranged which was predominantly normal, she did have isolated VE's and a few runs of SVT.  She presents for follow-up today after the above testing.  She states she is feeling well has no complaints to offer.  She continues to work out however the last few weeks she has had other obligations that have prohibited her from working out.  We did discuss repeat ischemic evaluation, she continues to have some shortness of breath which could be an anginal equivalent or deconditioning or obesity.  She is able to work out without any overt shortness of breath.  She denies chest pain, palpitations, dyspnea, pnd, orthopnea, n, v,  dizziness, syncope, edema, weight gain, or early satiety.    Past Medical History:  Diagnosis Date   Acute cystitis without hematuria 07/30/2019   Last Assessment & Plan:  Formatting of this note might be different from the original. UA most consistent with uncomplicated UTI. No red flag features to suggest need for imaging, special testing or urgent specialty consultation at this time. Antibiotic started per visit orders. Will send UA for culture to confirm sensitivity to abx given. Recommend f/u with PCP or return to clinic or ED for worse   Arthralgia of knee, left 05/17/2019   Last Assessment & Plan:  Formatting of this note might be different from the original. Recently provided intra-articular injection is done very well, could repeat in the future if persistent symptoms.   Bilateral carotid bruits 10/17/2019   Bladder problem    Cervical post-laminectomy syndrome 08/21/2017   Last Assessment & Plan:  Formatting of this note might be different from the original. Multilevel cervical fusion with continued neck and right upper extremity radicular pain.  Stable at this time without any progression, we will hold on any further imaging at this time.   Chest pain 12/21/2017   Last Assessment & Plan:  Intermittent. Non-radiating. No N/V/D. No vision changes.  Last Assessment & Plan:  Formatting of this note might be different from the original. No complaints of chest pain since hospital discharge.   Chronic foot pain, right 01/24/2020   Last Assessment & Plan:  Formatting  of this note might be different from the original. 67 year old female with chronic pain of multiple etiologies, at this time most limiting complaint is right foot pain secondary to refractory plantar fasciitis status post surgical intervention.  She is followed by Podiatry.  Today I recommended obtaining a more appropriate supportive footwear, we have also discu   Chronic left shoulder pain 06/19/2020   Last Assessment & Plan:  Formatting of  this note might be different from the original. Recently referred to orthopedics by PCP, several weeks status post intra-articular injection with excellent benefit.  I did let patient know that I am happy to repeat that procedure in the future should she need it.   Chronic pain of right ankle 06/19/2020   Chronic right shoulder pain 11/12/2018   Last Assessment & Plan:  Formatting of this note might be different from the original. Today we will take plain films of the shoulder to further evaluate etiology of her pain.  Discussed with patient that it is very likely radicular in nature, as I do not have any findings on exam suspicious of true shoulder etiology.   Chronically on opiate therapy 12/20/2016   Last Assessment & Plan:  Risks of this type of medication including the risk of addiction and, if misused, of death are reviewed with the patient.  The patient feels the benefits of pain relief and increased ability to engage in self-care activities improve the quality of life to outweigh these risks.  Last Assessment & Plan:  Formatting of this note might be different from the original. Will cont   Class 2 drug-induced obesity with serious comorbidity and body mass index (BMI) of 39.0 to 39.9 in adult 12/21/2017   Last Assessment & Plan:  Formatting of this note might be different from the original. BMI Assessment: Current Body mass index is 39.91 kg/m.  Patient BMI currently is above average (>25 kg/m2); BMI follow up plan is completed . Current barriers to healthy weight management include none.  BMI Plan:  Today, Marie Hughes and I have discussed methods to help address her current weight.   General weight l   Class 2 severe obesity due to excess calories with serious comorbidity and body mass index (BMI) of 39.0 to 39.9 in adult [facility]) 06/19/2020   Last Assessment & Plan:  Formatting of this note might be different from the original. BMI Assessment: Current Body mass index is 39.68 kg/m.  Patient BMI currently is  above average (>25 kg/m2); BMI follow up plan is completed . Current barriers to healthy weight management include pain.  BMI Plan:  Today, Marie Hughes and I have discussed methods to help address her current weight.   General weight l   Coronary artery disease involving native coronary artery of native heart with unstable angina pectoris (HCC) 09/12/2018   Last Assessment & Plan:  Formatting of this note might be different from the original. She is followed by cardiology and has an upcoming appointment with them.   Advised to continue current medications.   DDD (degenerative disc disease), lumbar 08/21/2017   Last Assessment & Plan:  Stble. No exacerbation of symptoms.  Last Assessment & Plan:  Formatting of this note might be different from the original. Chronic back pain and bilateral lower extremity radicular symptoms that appear to correspond to L4/5 distribution. Review of most recent lumbar MRI from 2013 shows multilevel degenerative changes most prominent at L4-5 where there is mild spinal steno   Degenerative cervical spinal stenosis 01/05/2016   Last  Assessment & Plan:  Formatting of this note might be different from the original. We will continue current medications.  Patient understands she may benefit percutaneous interventions.  Patient has had previous cervical spine surgeries.   Depression    Dietary counseling 04/17/2020   Dizziness 12/18/2020   Last Assessment & Plan:  Formatting of this note might be different from the original. Patient complains of dizziness and overall not feeling well.  She does complain of feelings of hot and cold as well as sweats.  She admits working at the [employer] is reunion in the direct heat.  She did this for several days.  She feels she may be dehydrated somewhat.  She has no focal neuro def   Dyspnea on exertion 04/12/2020   Dysuria 07/24/2019   Last Assessment & Plan:  Formatting of this note might be different from the original. Assessment  Pain  intermittent in the suprapubic. Mild backpain. Symptoms "come and go", then come back. Hx of kidney stone.  Reports weak urine stream Afebrile, no chills This may be a recurrence of kidney stone UA +wbc 75, will culture  Plan  Will start Flomax and await the urine culture   Encounter for screening mammogram for breast cancer 12/18/2019   Essential hypertension 01/05/2016   Last Assessment & Plan:  Formatting of this note might be different from the original. Blood pressure elevated today-follow-up with primary care for further management.   Exposure to 2019 novel coronavirus 07/01/2019   Last Assessment & Plan:  Formatting of this note might be different from the original. POCT Sars-Cov-2 Sophia NEGATIVE POCT Sars-COV-2 Aptima-COV  I have explained to the patient they must quarantine according to [facility] current guideline. They are encouraged to protect others in the household by wearing a mask, covering cough, and cleansing any area used by the patient, and if possible quarantine in    Heart disease 12/21/2017   Last Assessment & Plan:  No reports of chest pain in approximately 8 months. Will refill Nitro SL tabs.  Last Assessment & Plan:  Formatting of this note might be different from the original. Complaining of more frequent episodes of dyspnea on exertion, and bilateral leg swelling which are chronic concerns for her.  Advise she follow-up with her PCP in regards to these concerns for further evaluat   Hypertension    Hypertensive urgency 01/05/2016   Last Assessment & Plan:  Formatting of this note might be different from the original. Blood pressure is controlled today. 128/70. Advise to continue monitoring. Notify office for elevation. Continue current medications and f/u with cardiology.   IFG (impaired fasting glucose) 12/21/2017   Last Assessment & Plan:  Monitoring with blood work. POC according to results.'  Last Assessment & Plan:  Formatting of this note is different from the original. Lab Results   Component Value Date   HGBA1C 6.2 12/21/2017   Prediabetic. Monitoring with BW  Monitoring with blood work  Change in diet including a whole plant based diet, limiting meats especially red meat encouraged.   Avoiding unhea   Left bundle branch block (LBBB) 10/17/2019   Formatting of this note might be different from the original. Noted to have LBBB in old records. LBBB mentioned in cardiologist's office note 10/17/19.  Last Assessment & Plan:  Formatting of this note might be different from the original. This is chronic and was noted on recent stress test. She is following with cardiology. Advised to proceed with next appointment.  She  is asymptomatic today.   Long term use of drug 12/18/2019   Lumbar radiculitis 01/05/2016   Last Assessment & Plan:  Formatting of this note might be different from the original. Patient will continue with current medications.  Patient understands she may benefit percutaneous interventions.   Mixed hyperlipidemia 01/05/2016   Last Assessment & Plan:  Formatting of this note is different from the original. Problem Complexity:  Chronic problem/illness, stable  Pertinent Data: LDL Calculated (mg/dL)  Date Value  40/98/1191 48   Cholesterol (mg/dl)  Date Value  47/82/9562 135   HDL (mg/dl)  Date Value  13/01/6577 34.4 (L)   Triglycerides (mg/dl)  Date Value  46/96/2952 261 (H)   AST (U/L)  Date Value  12/18/2019 20   ALT (   Morbid obesity (HCC) 04/16/2018   Muscle pain    Muscle spasm of back 08/21/2017   Last Assessment & Plan:  Formatting of this note might be different from the original. Continue p.r.n. Soma   Nausea 12/18/2020   Obesity due to excess calories 01/05/2016   Last Assessment & Plan:  Formatting of this note might be different from the original. Diet was discussed and reviewed with the patient. Modify diet to eliminate bad fats (fast food, junk foods) and increase fiber intake. Regular exercise several times per week as appropriate, along with proper diet will help to  improve overall health. Discussion concerning the health risk of obesity educated. Edu   Otalgia, left ear 12/18/2019   Last Assessment & Plan:  Formatting of this note might be different from the original. Physical examination revealed a normal exam.  No evidence of infection.  No evidence of cerumen impaction.   Other chronic pain 08/21/2017   Last Assessment & Plan:  SEE BEHARRY is a 67 y.o.  year old female with Low back pain, Neck pain, due to Multilevel multifactorial degenerative changes in the lumbar and cervical spine, resulting in lower extremity radiculopathy bilaterally, in the setting of previous surgery in the cervical spine. Today I will refill the patient's current medications of Hydrocodone 10/325 mg PO PRN qid, and S   Pain and swelling of lower extremity, right 11/12/2018   Last Assessment & Plan:  Formatting of this note might be different from the original. Due to patient's recent onset of worsening lower extremity pain, with increased unilateral swelling and diffuse calf tenderness of the right extremity, we recommend she go for a right lower extremity ultrasound to rule out a DVT.   Pain medication agreement 08/10/2018   Last Assessment & Plan:  There is a current opioid agreement on file within the clinic and urine drug screens will be collected as needed to be in compliance with clinic policies.  Last Assessment & Plan:  Formatting of this note might be different from the original. Review of prior UDS results are consistent with regimen.  [place] controlled substance registry reviewed and is consistent wi   Pre-syncope 04/12/2020   Last Assessment & Plan:  Formatting of this note might be different from the original. No further episodes since hospital discharge.   Recurrent cystitis 12/18/2019   Recurrent major depressive disorder, in partial remission (HCC) 01/05/2016   Last Assessment & Plan:  Formatting of this note might be different from the original. This is a chronic  problem and is stable.  She denies suicidal or homicidal ideations.  Advised to continue current medications.   Suspected 2019 novel coronavirus infection 12/18/2020   Swelling  Thoracic radiculopathy 02/11/2019   Last Assessment & Plan:  Formatting of this note might be different from the original. Plain films of the thoracic spine completed after last appointment show ACDF with mid lower cervical spine with thoracic vertebral body alignment and disc spaces normal.  Responded well to previous oral steroids.  Have discussed possibility of C7-T1 CESI should her symptoms return.   Type 2 diabetes mellitus without complication, without long-term current use of insulin (HCC) 12/17/2020   Last Assessment & Plan:  Formatting of this note might be different from the original. Encourage her today to focus on losing weight, decreasing carbohydrate intake.  Hopefully she can make some improvements here to get this better controlled.   Uncomplicated opioid dependence (HCC) 01/05/2016   Viral syndrome 12/18/2020   Vitamin D deficiency 12/18/2019    Past Surgical History:  Procedure Laterality Date   ABDOMINAL HYSTERECTOMY     FOOT SURGERY     HAND SURGERY     BOTH HANDS   LEFT HEART CATH AND CORONARY ANGIOGRAPHY N/A 08/20/2018   Procedure: LEFT HEART CATH AND CORONARY ANGIOGRAPHY;  Surgeon: Corky Crafts, MD;  Location: [facility] INVASIVE CV LAB;  Service: Cardiovascular;  Laterality: N/A;   NECK SURGERIES      Current Medications: Current Meds  Medication Sig   amitriptyline (ELAVIL) 10 MG tablet Take 10 mg by mouth at bedtime.   amLODipine (NORVASC) 10 MG tablet Take 10 mg by mouth every evening.    aspirin EC 81 MG tablet Take 81 mg by mouth daily.   carisoprodol (SOMA) 350 MG tablet Take 350 mg by mouth daily as needed for muscle spasms.    carvedilol (COREG) 25 MG tablet Take 1 tablet (25 mg total) by mouth 2 (two) times daily with a meal.   Dietary Management Product (VASCULERA) TABS Take 630 mg by  mouth every morning.   HYDROcodone-acetaminophen (NORCO) 10-325 MG per tablet Take 1 tablet by mouth every 6 (six) hours as needed for moderate pain.   KLOR-CON M20 20 MEQ tablet Take 20 mEq by mouth daily at 3 pm.    lisinopril-hydrochlorothiazide (PRINZIDE,ZESTORETIC) 20-12.5 MG per tablet Take 1 tablet by mouth daily.   Multiple Vitamins-Minerals (ADULT GUMMY PO) Take 1 tablet by mouth 2 (two) times a week.   OVER THE COUNTER MEDICATION Take 1 tablet by mouth daily. B12, Calcium and D3   rosuvastatin (CRESTOR) 10 MG tablet Take 10 mg by mouth 2 (two) times daily.   [DISCONTINUED] nitroGLYCERIN (NITROSTAT) 0.4 MG SL tablet Place 1 tablet (0.4 mg total) under the tongue every 5 (five) minutes as needed for chest pain.     Allergies:   Nitrofurantoin   Social History   Socioeconomic History   Marital status: Widowed    Spouse name: Not on file   Number of children: Not on file   Years of education: Not on file   Highest education level: Not on file  Occupational History   Not on file  Tobacco Use   Smoking status: Former    Current packs/day: 0.00    Types: Cigarettes    Quit date: 2008    Years since quitting: 16.5   Smokeless tobacco: Never  Substance and Sexual Activity   Alcohol use: No    Alcohol/week: 0.0 standard drinks of alcohol   Drug use: No   Sexual activity: Not on file  Other Topics Concern   Not on file  Social History Narrative   Not on file  Social Determinants of Health   Financial Resource Strain: Not on file  Food Insecurity: Not on file  Transportation Needs: Not on file  Physical Activity: Not on file  Stress: Not on file  Social Connections: Not on file     Family History: The patient's family history includes Coronary artery disease in her father; Heart attack in her mother and paternal grandmother; Other in her brother and father; Pneumonia in her mother.  ROS:   Please see the history of present illness.     All other systems reviewed  and are negative.  EKGs/Labs/Other Studies Reviewed:    The following studies were reviewed today: Cardiac Studies & Procedures   CARDIAC CATHETERIZATION  CARDIAC CATHETERIZATION 08/20/2018  Narrative  Prox RCA lesion is 25% stenosed.  Mid Cx lesion is 40% stenosed.  Prox LAD lesion is 25% stenosed.  Ost 1st Diag lesion is 50% stenosed.  LV end diastolic pressure is moderately elevated.  The left ventricular ejection fraction is 50-55% by visual estimate.  The left ventricular systolic function is normal.  There is no aortic valve stenosis.  Nonobstructive CAD.  Moderately increased LVEDP.  Consider diuretic to help with shortness of breath.  Continue with aggressive risk factor modification including weight loss.  Findings Coronary Findings Diagnostic  Dominance: Right  Left Anterior Descending Prox LAD lesion is 25% stenosed.  First Diagonal Branch Ost 1st Diag lesion is 50% stenosed.  Left Circumflex Vessel is small. Mid Cx lesion is 40% stenosed.  Right Coronary Artery Prox RCA lesion is 25% stenosed.  Intervention  No interventions have been documented.   STRESS TESTS  MYOCARDIAL PERFUSION IMAGING 05/16/2018  Narrative  The left ventricular ejection fraction is normal (55-65%).  Nuclear stress EF: 58%.  There was no ST segment deviation noted during stress.  The study is normal.  This is a low risk study.  Thinning of the anteroseptum at mid and apical level likely from LBBB No ischemia EF 58% with no RWMAls low risk study   ECHOCARDIOGRAM  ECHOCARDIOGRAM COMPLETE 12/06/2022  Narrative ECHOCARDIOGRAM REPORT    Patient Name:   BRYONY Leppert Date of Exam: 12/06/2022 Medical Rec #:  [id number]     Height:       61.5 in Accession #:    [id number]    Weight:       216.0 lb Date of Birth:  01/22/1956    BSA:          1.963 m Patient Age:    66 years      BP:           120/80 mmHg Patient Gender: F             HR:           61  bpm. Exam Location:  [facility]  Procedure: 2D Echo, Cardiac Doppler, Color Doppler and Strain Analysis  Indications:    Shortness of breath [R06.02 (ICD-10-CM)]  History:        Patient has prior history of Echocardiogram examinations, most recent 05/28/2018. CAD, Arrythmias:LBBB; Risk Factors:Hypertension, Dyslipidemia and Diabetes.  Sonographer:    Margreta Journey RDCS Referring Phys: [phone] Athan Casalino C Ylianna Almanzar  IMPRESSIONS   1. Left ventricular ejection fraction, by estimation, is 55 to 60%. The left ventricle has normal function. The left ventricle has no regional wall motion abnormalities. There is mild concentric left ventricular hypertrophy. Left ventricular diastolic parameters are consistent with Grade I diastolic dysfunction (impaired relaxation). The average left ventricular global longitudinal strain  is -17.0 %. 2. Right ventricular systolic function is normal. The right ventricular size is normal. There is normal pulmonary artery systolic pressure. 3. The mitral valve is normal in structure. Mild mitral valve regurgitation. No evidence of mitral stenosis. 4. The aortic valve is normal in structure. Aortic valve regurgitation is not visualized. No aortic stenosis is present. 5. Aortic Normal DTA. There is mild dilatation of the ascending aorta, measuring 38 mm. 6. The inferior vena cava is normal in size with greater than 50% respiratory variability, suggesting right atrial pressure of 3 mmHg.  FINDINGS Left Ventricle: Left ventricular ejection fraction, by estimation, is 55 to 60%. The left ventricle has normal function. The left ventricle has no regional wall motion abnormalities. The average left ventricular global longitudinal strain is -17.0 %. The left ventricular internal cavity size was normal in size. There is mild concentric left ventricular hypertrophy. Left ventricular diastolic parameters are consistent with Grade I diastolic dysfunction (impaired relaxation). Normal  left ventricular filling pressure.  Right Ventricle: The right ventricular size is normal. No increase in right ventricular wall thickness. Right ventricular systolic function is normal. There is normal pulmonary artery systolic pressure. The tricuspid regurgitant velocity is 2.41 m/s, and with an assumed right atrial pressure of 3 mmHg, the estimated right ventricular systolic pressure is 26.2 mmHg.  Left Atrium: Left atrial size was normal in size.  Right Atrium: Right atrial size was normal in size.  Pericardium: There is no evidence of pericardial effusion.  Mitral Valve: The mitral valve is normal in structure. Mild mitral valve regurgitation. No evidence of mitral valve stenosis.  Tricuspid Valve: The tricuspid valve is normal in structure. Tricuspid valve regurgitation is mild . No evidence of tricuspid stenosis.  Aortic Valve: The aortic valve is normal in structure. Aortic valve regurgitation is not visualized. No aortic stenosis is present. Aortic valve mean gradient measures 6.3 mmHg. Aortic valve peak gradient measures 12.0 mmHg. Aortic valve area, by VTI measures 2.38 cm.  Pulmonic Valve: The pulmonic valve was normal in structure. Pulmonic valve regurgitation is not visualized. No evidence of pulmonic stenosis.  Aorta: The aortic root is normal in size and structure, the aortic arch was not well visualized and Normal DTA. There is mild dilatation of the ascending aorta, measuring 38 mm.  Venous: A normal flow pattern is recorded from the right upper pulmonary vein. The inferior vena cava is normal in size with greater than 50% respiratory variability, suggesting right atrial pressure of 3 mmHg.  IAS/Shunts: No atrial level shunt detected by color flow Doppler.   LEFT VENTRICLE PLAX 2D LVIDd:         5.00 cm   Diastology LVIDs:         3.50 cm   LV e' medial:    7.83 cm/s LV PW:         1.10 cm   LV E/e' medial:  12.4 LV IVS:        1.10 cm   LV e' lateral:   11.00  cm/s LVOT diam:     2.00 cm   LV E/e' lateral: 8.8 LV SV:         93 LV SV Index:   47        2D Longitudinal Strain LVOT Area:     3.14 cm  2D Strain GLS Avg:     -17.0 %   RIGHT VENTRICLE             IVC RV Basal diam:  3.40 cm     IVC diam: 1.80 cm RV Mid diam:    2.70 cm RV S prime:     12.50 cm/s TAPSE (M-mode): 2.7 cm  LEFT ATRIUM             Index        RIGHT ATRIUM           Index LA diam:        4.90 cm 2.50 cm/m   RA Area:     17.20 cm LA Vol (A2C):   85.7 ml 43.65 ml/m  RA Volume:   42.10 ml  21.44 ml/m LA Vol (A4C):   61.2 ml 31.17 ml/m LA Biplane Vol: 73.3 ml 37.33 ml/m AORTIC VALVE AV Area (Vmax):    2.22 cm AV Area (Vmean):   2.22 cm AV Area (VTI):     2.38 cm AV Vmax:           173.00 cm/s AV Vmean:          114.000 cm/s AV VTI:            0.390 m AV Peak Grad:      12.0 mmHg AV Mean Grad:      6.3 mmHg LVOT Vmax:         122.00 cm/s LVOT Vmean:        80.500 cm/s LVOT VTI:          0.295 m LVOT/AV VTI ratio: 0.76  AORTA Ao Root diam: 3.10 cm Ao Asc diam:  3.80 cm Ao Desc diam: 2.10 cm  MITRAL VALVE               TRICUSPID VALVE MV Area (PHT): 3.53 cm    TR Peak grad:   23.2 mmHg MV Decel Time: 215 msec    TR Vmax:        241.00 cm/s MR Peak grad: 37.0 mmHg MR Vmax:      304.00 cm/s  SHUNTS MV E velocity: 97.20 cm/s  Systemic VTI:  0.30 m MV A velocity: 90.10 cm/s  Systemic Diam: 2.00 cm MV E/A ratio:  1.08  Norman Herrlich MD Electronically signed by Norman Herrlich MD Signature Date/Time: 12/06/2022/4:56:14 PM    Final    MONITORS  LONG TERM MONITOR (3-14 DAYS) 11/25/2022  Narrative Patch Wear Time:  8 days and 6 hours (2024-05-31T11:48:53-0400 to 2024-06-08T18:23:30-0400)  Patient had a min HR of 47 bpm, max HR of 158 bpm, and avg HR of 65 bpm.  Predominant underlying rhythm was Sinus Rhythm. Bundle Branch Block/IVCD was present.  2 Supraventricular Tachycardia runs occurred, the run with the fastest interval lasting 10 beats  with a max rate of 158 bpm, the longest lasting 14 beats with an avg rate of 118 bpm. Isolated SVEs were rare (<1.0%), SVE Couplets were rare (<1.0%), and SVE Triplets were rare (<1.0%).  Isolated VEs were rare (<1.0%, 1284), VE Couplets were rare (<1.0%, 80), and VE Triplets were rare (<1.0%, 7).  Impression: Unremarkable event monitor with brief atrial runs noted.            EKG: Most recent EKG was obtained on 06/29/2022, this revealed sinus bradycardia, interventricular conduction delay, consistent with prior EKG tracings, heart rate 58 bpm.  Recent Labs: No results found for requested labs within last 365 days.  Recent Lipid Panel    Component Value Date/Time   CHOL 175 09/07/2021 1142   TRIG 204 (H) 09/07/2021 1142   HDL 48 09/07/2021 1142  CHOLHDL 3.6 09/07/2021 1142   LDLCALC 92 09/07/2021 1142     Risk Assessment/Calculations:                Physical Exam:    VS:  BP 138/88 (BP Location: Right Arm, Patient Position: Sitting, Cuff Size: Normal)   Pulse (!) 57   Ht 5' (1.524 m)   Wt 215 lb (97.5 kg)   SpO2 96%   BMI 41.99 kg/m     Wt Readings from Last 3 Encounters:  12/23/22 215 lb (97.5 kg)  11/11/22 216 lb (98 kg)  09/07/21 213 lb (96.6 kg)     GEN:  Well nourished, well developed in no acute distress HEENT: Normal NECK: No JVD; No carotid bruits LYMPHATICS: No lymphadenopathy CARDIAC: RRR, 2/6 systolic murmur, rubs, gallops RESPIRATORY:  Clear to auscultation without rales, wheezing or rhonchi  ABDOMEN: Soft, non-tender, non-distended MUSCULOSKELETAL:  No edema; No deformity  SKIN: Warm and dry NEUROLOGIC:  Alert and oriented x 3 PSYCHIATRIC:  Normal affect   ASSESSMENT:    1. Essential hypertension   2. Coronary artery disease involving native coronary artery of native heart with unstable angina pectoris (HCC)   3. Shortness of breath   4. Medication management   5. SVT (supraventricular tachycardia)     PLAN:    In order of problems  listed above:  Shortness of breath-repeat echogram was largely unchanged, she does endorse some shortness of breath however feels like it was better than she was last evaluated in our office.  Likely multifactorial related to obesity, deconditioning.  We discussed that this could be an anginal equivalent, offered repeat ischemic evaluation however she politely declines, as she is still able to exercise without any overt symptoms. Palpitations/SVT-recent lab work was reviewed including TSH, CBC, BMET within the last 2 weeks and was WNL.  Monitor revealed predominant sinus rhythm, episodes of SVT and VE's, although infrequent.  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 Stable with no anginal symptoms. No indication for ischemic evaluation.  Continue aspirin 81 mg daily, continue Coreg 25 mg twice daily, continue nitroglycerin as needed--has not needed, continue Crestor 20 mg daily.   Hypertension-blood pressure is 138/88, continue Norvasc 10 mg daily, continue carvedilol 25 mg twice daily, continue lisinopril-HCTZ 20-12.5 mg daily. Hyperlipidemia-most recent lipid profile by her PCP revealed LDL of 81, triglycerides of 226, her Crestor was increased to 20 mg daily.  Will repeat direct LDL, CMET today.  Disposition-CMET, direct LDL.  Return in 6 months with Dr. Tomie China       Medication Adjustments/Labs and Tests Ordered: Current medicines are reviewed at length with the patient today.  Concerns regarding medicines are outlined above.  Orders Placed This Encounter  Procedures   LDL cholesterol, direct   Comprehensive metabolic panel   Meds ordered this encounter  Medications   nitroGLYCERIN (NITROSTAT) 0.4 MG SL tablet    Sig: Place 1 tablet (0.4 mg total) under the tongue every 5 (five) minutes as needed for chest pain.    Dispense:  25 tablet    Refill:  6    Patient Instructions  Medication Instructions:  Your physician recommends that you  continue on your current medications as directed. Please refer to the Current Medication list given to you today.  *If you need a refill on your cardiac medications before your next appointment, please call your pharmacy*   Lab Work: Direct LDL, CMP- today If you have labs (blood work) drawn  today and your tests are completely normal, you will receive your results only by: MyChart Message (if you have MyChart) OR A paper copy in the mail If you have any lab test that is abnormal or we need to change your treatment, we will call you to review the results.   Testing/Procedures: None Ordered   Follow-Up: At [facility], you and your health needs are our priority.  As part of our continuing mission to provide you with exceptional heart care, we have created designated Provider Care Teams.  These Care Teams include your primary Cardiologist (physician) and Advanced Practice Providers (APPs -  Physician Assistants and Nurse Practitioners) who all work together to provide you with the care you need, when you need it.  We recommend signing up for the patient portal called "MyChart".  Sign up information is provided on this After Visit Summary.  MyChart is used to connect with patients for Virtual Visits (Telemedicine).  Patients are able to view lab/test results, encounter notes, upcoming appointments, etc.  Non-urgent messages can be sent to your provider as well.   To learn more about what you can do with MyChart, go to [email or web address].    Your next appointment:   6 month(s) with Dr. Tomie China  The format for your next appointment:   In Person  Provider:   Wallis Bamberg, NP   Other Instructions NA    Signed, Flossie Dibble, NP  12/23/2022 12:07 PM    [facility]

## 2022-12-23 ENCOUNTER — Ambulatory Visit: Payer: Medicare PPO | Attending: Cardiology | Admitting: Cardiology

## 2022-12-23 ENCOUNTER — Encounter: Payer: Self-pay | Admitting: Cardiology

## 2022-12-23 VITALS — BP 138/88 | HR 57 | Ht 60.0 in | Wt 215.0 lb

## 2022-12-23 DIAGNOSIS — R0602 Shortness of breath: Secondary | ICD-10-CM

## 2022-12-23 DIAGNOSIS — I10 Essential (primary) hypertension: Secondary | ICD-10-CM

## 2022-12-23 DIAGNOSIS — I25110 Atherosclerotic heart disease of native coronary artery with unstable angina pectoris: Secondary | ICD-10-CM | POA: Diagnosis not present

## 2022-12-23 DIAGNOSIS — I471 Supraventricular tachycardia, unspecified: Secondary | ICD-10-CM

## 2022-12-23 DIAGNOSIS — Z79899 Other long term (current) drug therapy: Secondary | ICD-10-CM

## 2022-12-23 MED ORDER — NITROGLYCERIN 0.4 MG SL SUBL: 0.4000 mg | SUBLINGUAL_TABLET | SUBLINGUAL | 6 refills | Status: DC | PRN

## 2022-12-23 NOTE — Patient Instructions (Addendum)
Medication Instructions:  Your physician recommends that you continue on your current medications as directed. Please refer to the Current Medication list given to you today.  *If you need a refill on your cardiac medications before your next appointment, please call your pharmacy*   Lab Work: Direct LDL, CMP- today If you have labs (blood work) drawn today and your tests are completely normal, you will receive your results only by: MyChart Message (if you have MyChart) OR A paper copy in the mail If you have any lab test that is abnormal or we need to change your treatment, we will call you to review the results.   Testing/Procedures: None Ordered   Follow-Up: At [facility], you and your health needs are our priority.  As part of our continuing mission to provide you with exceptional heart care, we have created designated Provider Care Teams.  These Care Teams include your primary Cardiologist (physician) and Advanced Practice Providers (APPs -  Physician Assistants and Nurse Practitioners) who all work together to provide you with the care you need, when you need it.  We recommend signing up for the patient portal called "MyChart".  Sign up information is provided on this After Visit Summary.  MyChart is used to connect with patients for Virtual Visits (Telemedicine).  Patients are able to view lab/test results, encounter notes, upcoming appointments, etc.  Non-urgent messages can be sent to your provider as well.   To learn more about what you can do with MyChart, go to [email or web address].    Your next appointment:   6 month(s) with Dr. Tomie China  The format for your next appointment:   In Person  Provider:   Wallis Bamberg, NP   Other Instructions NA

## 2022-12-24 LAB — COMPREHENSIVE METABOLIC PANEL WITH GFR
ALT: 19 IU/L (ref 0–32)
AST: 18 IU/L (ref 0–40)
Albumin: 4.4 g/dL (ref 3.9–4.9)
Alkaline Phosphatase: 83 IU/L (ref 44–121)
BUN/Creatinine Ratio: 19 (ref 12–28)
BUN: 16 mg/dL (ref 8–27)
Bilirubin Total: 0.3 mg/dL (ref 0.0–1.2)
CO2: 24 mmol/L (ref 20–29)
Calcium: 9.2 mg/dL (ref 8.7–10.3)
Chloride: 103 mmol/L (ref 96–106)
Creatinine, Ser: 0.85 mg/dL (ref 0.57–1.00)
Globulin, Total: 2.3 g/dL (ref 1.5–4.5)
Glucose: 111 mg/dL — ABNORMAL HIGH (ref 70–99)
Potassium: 4 mmol/L (ref 3.5–5.2)
Sodium: 141 mmol/L (ref 134–144)
Total Protein: 6.7 g/dL (ref 6.0–8.5)
eGFR: 76 mL/min/1.73

## 2022-12-24 LAB — LDL CHOLESTEROL, DIRECT: LDL Direct: 88 mg/dL (ref 0–99)

## 2022-12-26 ENCOUNTER — Telehealth: Payer: Self-pay

## 2022-12-26 DIAGNOSIS — E782 Mixed hyperlipidemia: Secondary | ICD-10-CM

## 2022-12-26 DIAGNOSIS — I25110 Atherosclerotic heart disease of native coronary artery with unstable angina pectoris: Secondary | ICD-10-CM

## 2022-12-26 MED ORDER — ROSUVASTATIN CALCIUM 10 MG PO TABS: ORAL_TABLET | ORAL | 3 refills | Status: DC

## 2022-12-26 NOTE — Telephone Encounter (Signed)
Pt viewed results in My Chart per Dr. Krasowski's note. Routed to PCP.  

## 2022-12-26 NOTE — Telephone Encounter (Signed)
Left VM for pt to callback.   Normal kidney, liver and electrolytes. Bad cholesterol is still elevated beyond what it should be. Increase your Crestor 10 mg in am and 20 mg evening. Return for fasting lipid panel and liver function testing in 8 weeks.   Double your current dose evening dose to 20 mg.   Your physician recommends that you return for lab work in: 8 weeks for fasting lipids and LFT's. You can come Monday through Friday 8:30 am to 12:00 pm and 1:15 to 4:30. You do not need to make an appointment as the order has already been placed.   Call [phone] for questions or concerns. Dala Dock

## 2023-06-21 ENCOUNTER — Ambulatory Visit: Payer: Medicare PPO | Attending: Cardiology | Admitting: Cardiology

## 2023-06-21 ENCOUNTER — Encounter: Payer: Self-pay | Admitting: Cardiology

## 2023-06-21 VITALS — BP 132/88 | HR 58 | Ht 61.0 in | Wt 218.0 lb

## 2023-06-21 DIAGNOSIS — I25119 Atherosclerotic heart disease of native coronary artery with unspecified angina pectoris: Secondary | ICD-10-CM

## 2023-06-21 DIAGNOSIS — I209 Angina pectoris, unspecified: Secondary | ICD-10-CM | POA: Insufficient documentation

## 2023-06-21 DIAGNOSIS — E088 Diabetes mellitus due to underlying condition with unspecified complications: Secondary | ICD-10-CM

## 2023-06-21 DIAGNOSIS — E782 Mixed hyperlipidemia: Secondary | ICD-10-CM | POA: Diagnosis not present

## 2023-06-21 DIAGNOSIS — I259 Chronic ischemic heart disease, unspecified: Secondary | ICD-10-CM

## 2023-06-21 DIAGNOSIS — I2510 Atherosclerotic heart disease of native coronary artery without angina pectoris: Secondary | ICD-10-CM | POA: Insufficient documentation

## 2023-06-21 DIAGNOSIS — I10 Essential (primary) hypertension: Secondary | ICD-10-CM | POA: Diagnosis not present

## 2023-06-21 MED ORDER — NITROGLYCERIN 0.4 MG SL SUBL: 0.4000 mg | SUBLINGUAL_TABLET | SUBLINGUAL | 6 refills | Status: AC | PRN

## 2023-06-21 NOTE — Progress Notes (Signed)
 Cardiology Office Note:    Date:  06/21/2023   ID:  Marie Hughes, DOB 05/29/1956, MRN [id number]  PCP:  Zackary Reena CROME, FNP  Cardiologist:  Jennifer JONELLE Crape, MD   Referring MD: Zackary Reena CROME, FNP    ASSESSMENT:    1. Essential hypertension   2. Coronary artery disease involving native coronary artery of native heart without angina pectoris   3. Diabetes mellitus due to underlying condition with unspecified complications (HCC)   4. Mixed hyperlipidemia   5. Angina pectoris (HCC)    PLAN:    In order of problems listed above:  Angina pectoris and coronary artery disease: Patient has coronary artery disease and symptoms are concerning.  I discussed this with her at length.  She was advised to continue taking a coated baby aspirin  on a daily basis.  Sublingual nitroglycerin  prescription was sent, its protocol and 911 protocol explained and the patient vocalized understanding questions were answered to the patient's satisfaction.  I discussed various modalities of evaluation and she is agreeable on CT coronary angiography and we will set this up for her. Essential hypertension: Blood pressure is stable and diet was emphasized. Mixed dyslipidemia: On lipid-lowering medications followed by primary care.  Lipids are at goal.  I reviewed her recent lab work and discussed findings with her at length. Morbid obesity: Weight reduction stressed.  Diet emphasized and she promises to do better. Patient will be seen in follow-up appointment in 6 months or earlier if the patient has any concerns.    Medication Adjustments/Labs and Tests Ordered: Current medicines are reviewed at length with the patient today.  Concerns regarding medicines are outlined above.  No orders of the defined types were placed in this encounter.  No orders of the defined types were placed in this encounter.    No chief complaint on file.    History of Present Illness:    Marie Hughes is a 68 y.o. female.   Patient has past medical history of coronary artery disease nonobstructive in nature, essential hypertension, mixed dyslipidemia morbid obesity.  She is a diabetic.  She tells me occasionally she will have chest tightness going to the neck.  She has used nitroglycerin  with at times.  She does not connect to any stress or any such issues.  At the time of my evaluation, the patient is alert awake oriented and in no distress.  Past Medical History:  Diagnosis Date   Acute cystitis without hematuria 07/30/2019   Last Assessment & Plan:  Formatting of this note might be different from the original. UA most consistent with uncomplicated UTI. No red flag features to suggest need for imaging, special testing or urgent specialty consultation at this time. Antibiotic started per visit orders. Will send UA for culture to confirm sensitivity to abx given. Recommend f/u with PCP or return to clinic or ED for worse   Arthralgia of knee, left 05/17/2019   Last Assessment & Plan:  Formatting of this note might be different from the original. Recently provided intra-articular injection is done very well, could repeat in the future if persistent symptoms.   Arthropathy, unspecified 11/01/2022   Last Assessment & Plan: Formatting of this note might be different from the original. Problem complexity: Chronic problem/illness, stable Assessment:  she endorses chronic pain of joints in multiple sites. Plan: labs and will advise accordingly.     Bilateral carotid bruits 10/17/2019   Bilateral leg cramps 12/07/2021   Last Assessment & Plan: Formatting of this note  might be different from the original. New to provider. Problem complexity: Chronic problem/illness, with progression Assessment:  Nocturnal BLE cramps for several months. This is likely multifactoral given her spinal disease and possible hypokalemia. Plan: labs today and will advise accordingly. Supportive care discussed.     Bladder problem    Cervical  post-laminectomy syndrome 08/21/2017   Last Assessment & Plan:  Formatting of this note might be different from the original. Multilevel cervical fusion with continued neck and right upper extremity radicular pain.  Stable at this time without any progression, we will hold on any further imaging at this time.   Chest pain 12/21/2017   Last Assessment & Plan:  Intermittent. Non-radiating. No N/V/D. No vision changes.  Last Assessment & Plan:  Formatting of this note might be different from the original. No complaints of chest pain since hospital discharge.   Chronic foot pain, right 01/24/2020   Last Assessment & Plan:  Formatting of this note might be different from the original. 68 year old female with chronic pain of multiple etiologies, at this time most limiting complaint is right foot pain secondary to refractory plantar fasciitis status post surgical intervention.  She is followed by Podiatry.  Today I recommended obtaining a more appropriate supportive footwear, we have also discu   Chronic left shoulder pain 06/19/2020   Last Assessment & Plan:  Formatting of this note might be different from the original. Recently referred to orthopedics by PCP, several weeks status post intra-articular injection with excellent benefit.  I did let patient know that I am happy to repeat that procedure in the future should she need it.   Chronic left-sided thoracic back pain 07/14/2022   Chronic pain of right ankle 06/19/2020   Chronic right shoulder pain 11/12/2018   Last Assessment & Plan:  Formatting of this note might be different from the original. Today we will take plain films of the shoulder to further evaluate etiology of her pain.  Discussed with patient that it is very likely radicular in nature, as I do not have any findings on exam suspicious of true shoulder etiology.   Chronically on opiate therapy 12/20/2016   Last Assessment & Plan:  Risks of this type of medication including the risk of  addiction and, if misused, of death are reviewed with the patient.  The patient feels the benefits of pain relief and increased ability to engage in self-care activities improve the quality of life to outweigh these risks.  Last Assessment & Plan:  Formatting of this note might be different from the original. Will cont   Class 2 drug-induced obesity with serious comorbidity and body mass index (BMI) of 39.0 to 39.9 in adult 12/21/2017   Last Assessment & Plan:  Formatting of this note might be different from the original. BMI Assessment: Current Body mass index is 39.91 kg/m.  Patient BMI currently is above average (>25 kg/m2); BMI follow up plan is completed . Current barriers to healthy weight management include none.  BMI Plan:  Today, Bridgett and I have discussed methods to help address her current weight.   General weight l   Class 2 severe obesity due to excess calories with serious comorbidity and body mass index (BMI) of 39.0 to 39.9 in adult [facility]) 06/19/2020   Last Assessment & Plan:  Formatting of this note might be different from the original. BMI Assessment: Current Body mass index is 39.68 kg/m.  Patient BMI currently is above average (>25 kg/m2); BMI follow up plan is  completed . Current barriers to healthy weight management include pain.  BMI Plan:  Today, Jeanet and I have discussed methods to help address her current weight.   General weight l   Complete tear of rotator cuff 07/14/2022   Coronary artery disease involving native coronary artery of native heart with unstable angina pectoris (HCC) 09/12/2018   Last Assessment & Plan:  Formatting of this note might be different from the original. She is followed by cardiology and has an upcoming appointment with them.   Advised to continue current medications.   DDD (degenerative disc disease), lumbar 08/21/2017   Last Assessment & Plan:  Stble. No exacerbation of symptoms.  Last Assessment & Plan:  Formatting of this note might be different  from the original. Chronic back pain and bilateral lower extremity radicular symptoms that appear to correspond to L4/5 distribution. Review of most recent lumbar MRI from 2013 shows multilevel degenerative changes most prominent at L4-5 where there is mild spinal steno   Degenerative cervical spinal stenosis 01/05/2016   Last Assessment & Plan:  Formatting of this note might be different from the original. We will continue current medications.  Patient understands she may benefit percutaneous interventions.  Patient has had previous cervical spine surgeries.   Depression    Diabetes mellitus due to underlying condition with unspecified complications (HCC) 04/12/2021   Dietary counseling 04/17/2020   Diverticulosis 07/14/2022   Dizziness 12/18/2020   Last Assessment & Plan:  Formatting of this note might be different from the original. Patient complains of dizziness and overall not feeling well.  She does complain of feelings of hot and cold as well as sweats.  She admits working at the [employer] is reunion in the direct heat.  She did this for several days.  She feels she may be dehydrated somewhat.  She has no focal neuro def   Dyspnea on exertion 04/12/2020   Dysuria 07/24/2019   Last Assessment & Plan:  Formatting of this note might be different from the original. Assessment  Pain intermittent in the suprapubic. Mild backpain. Symptoms come and go, then come back. Hx of kidney stone.  Reports weak urine stream Afebrile, no chills This may be a recurrence of kidney stone UA +wbc 75, will culture  Plan  Will start Flomax and await the urine culture   Elevated blood pressure reading 07/29/2021   Last Assessment & Plan:   Formatting of this note might be different from the original.  Blood pressure elevated in clinic today.  Patient will continue to follow-up with his primary care provider for further management.     Encounter for screening mammogram for breast cancer 12/18/2019    Essential hypertension 01/05/2016   Last Assessment & Plan:  Formatting of this note might be different from the original. Blood pressure elevated today-follow-up with primary care for further management.   Exposure to 2019 novel coronavirus 07/01/2019   Last Assessment & Plan:  Formatting of this note might be different from the original. POCT Sars-Cov-2 Sophia NEGATIVE POCT Sars-COV-2 Aptima-COV  I have explained to the patient they must quarantine according to [facility] current guideline. They are encouraged to protect others in the household by wearing a mask, covering cough, and cleansing any area used by the patient, and if possible quarantine in    Heart disease 12/21/2017   Last Assessment & Plan:  No reports of chest pain in approximately 8 months. Will refill Nitro SL tabs.  Last Assessment & Plan:  Formatting of this note might be different from the original. Complaining of more frequent episodes of dyspnea on exertion, and bilateral leg swelling which are chronic concerns for her.  Advise she follow-up with her PCP in regards to these concerns for further evaluat   Hematuria of unknown etiology 07/14/2022   Last Assessment & Plan: Formatting of this note might be different from the original. Patient complaining of left flank pain that is chronic.  However, we obtained a urine to rule out an underlying cystitis or pyelonephritis.  Her urine did show some hematuria.  We will send for microscopic analysis as well as culture.     History of COVID-19 04/12/2022   Last Assessment & Plan: Formatting of this note might be different from the original. Acute illness that is progressing. Diagnosis COVID 19 per rapid testing. Plan Discussed the importance of avoiding unnecessary antibiotic therapy. Suggested symptomatic OTC remedies. OTC antihistamine for rash. Discussed paxlovid. She defers at this time and requests to manage without pharmacotherapy. She has no    Hypertension    Hypertensive urgency  01/05/2016   Last Assessment & Plan:  Formatting of this note might be different from the original. Blood pressure is controlled today. 128/70. Advise to continue monitoring. Notify office for elevation. Continue current medications and f/u with cardiology.   IFG (impaired fasting glucose) 12/21/2017   Last Assessment & Plan:  Monitoring with blood work. POC according to results.'  Last Assessment & Plan:  Formatting of this note is different from the original. Lab Results  Component Value Date   HGBA1C 6.2 12/21/2017   Prediabetic. Monitoring with BW  Monitoring with blood work  Change in diet including a whole plant based diet, limiting meats especially red meat encouraged.   Avoiding unhea   Impingement syndrome of left shoulder 07/14/2022   Lack of energy 11/01/2022   Last Assessment & Plan: Formatting of this note might be different from the original. Undiagnosed problem with uncertain prognosis. Assessment: complains of chronic fatigue. Plan: labs and will advise accordingly     Left bundle branch block (LBBB) 10/17/2019   Formatting of this note might be different from the original. Noted to have LBBB in old records. LBBB mentioned in cardiologist's office note 10/17/19.  Last Assessment & Plan:  Formatting of this note might be different from the original. This is chronic and was noted on recent stress test. She is following with cardiology. Advised to proceed with next appointment.  She is asymptomatic today.   Left upper quadrant pain 08/03/2021   Last Assessment & Plan:   Formatting of this note might be different from the original.  Problem complexity: Chronic problem/illness, with progression     Assessment:  Left upper quadrant pain for several months. She is fearful may have pancreatic cancer due to significant family hx of similar presentation. She is scheduled for MRI of the thoracic spine this week to evaluate for issues of the spin   Localized swelling of both lower legs 04/13/2021    Last Assessment & Plan:   Formatting of this note might be different from the original.  Patient complains of an approximate 2 month onset of bilateral lower extremity swelling.  However, upon exam I did not note any changes from previous exam.  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  There was no pitting.  Weight is stable without significant weight gain.  Her lungs were clear to ausc   Long term (current) use of opiate analgesic 12/20/2016  Last Assessment & Plan: Formatting of this note might be different from the original. Patient and I have discussed the hazardous effects of continued opiate pain medication usage. Risks and benefits of above medications including but not limited to possibility of hyperalgesia, respiratory depression, sedation, and even death were discussed with the patient who expressed an understanding. Patient i   Long term use of drug 12/18/2019   Lumbar radiculitis 01/05/2016   Last Assessment & Plan:  Formatting of this note might be different from the original. Patient will continue with current medications.  Patient understands she may benefit percutaneous interventions.   Mass of right foot 07/14/2022   Mixed hyperlipidemia 01/05/2016   Last Assessment & Plan:  Formatting of this note is different from the original. Problem Complexity:  Chronic problem/illness, stable  Pertinent Data: LDL Calculated (mg/dL)  Date Value  88/98/7978 48   Cholesterol (mg/dl)  Date Value  88/98/7978 135   HDL (mg/dl)  Date Value  88/98/7978 34.4 (L)   Triglycerides (mg/dl)  Date Value  88/98/7978 261 (H)   AST (U/L)  Date Value  12/18/2019 20   ALT (   Morbid obesity (HCC) 04/16/2018   Muscle pain    Muscle spasm of back 08/21/2017   Last Assessment & Plan:  Formatting of this note might be different from the original. Continue p.r.n. Soma   MVP (mitral valve prolapse) 07/14/2022   Nausea 12/18/2020   Nephrolithiasis 07/14/2022   Nocturia 07/14/2022   Obesity due to excess  calories 01/05/2016   Last Assessment & Plan:  Formatting of this note might be different from the original. Diet was discussed and reviewed with the patient. Modify diet to eliminate bad fats (fast food, junk foods) and increase fiber intake. Regular exercise several times per week as appropriate, along with proper diet will help to improve overall health. Discussion concerning the health risk of obesity educated. Edu   Otalgia, left ear 12/18/2019   Last Assessment & Plan:  Formatting of this note might be different from the original. Physical examination revealed a normal exam.  No evidence of infection.  No evidence of cerumen impaction.   Other chronic pain 08/21/2017   Last Assessment & Plan:  ZAKIRA RESSEL is a 68 y.o.  year old female with Low back pain, Neck pain, due to Multilevel multifactorial degenerative changes in the lumbar and cervical spine, resulting in lower extremity radiculopathy bilaterally, in the setting of previous surgery in the cervical spine. Today I will refill the patient's current medications of Hydrocodone 10/325 mg PO PRN qid, and S   Pain and swelling of lower extremity, right 11/12/2018   Last Assessment & Plan:  Formatting of this note might be different from the original. Due to patient's recent onset of worsening lower extremity pain, with increased unilateral swelling and diffuse calf tenderness of the right extremity, we recommend she go for a right lower extremity ultrasound to rule out a DVT.   Pain medication agreement 08/10/2018   Last Assessment & Plan:  There is a current opioid agreement on file within the clinic and urine drug screens will be collected as needed to be in compliance with clinic policies.  Last Assessment & Plan:  Formatting of this note might be different from the original. Review of prior UDS results are consistent with regimen.  [place]  controlled substance registry reviewed and is consistent wi   Plantar fasciitis, right  07/14/2022   Postmenopausal state 10/11/2021   Pre-syncope 04/12/2020  Last Assessment & Plan:  Formatting of this note might be different from the original. No further episodes since hospital discharge.   Recurrent cystitis 12/18/2019   Recurrent major depressive disorder, in partial remission (HCC) 01/05/2016   Last Assessment & Plan:  Formatting of this note might be different from the original. This is a chronic problem and is stable.  She denies suicidal or homicidal ideations.  Advised to continue current medications.   Recurrent UTI 07/14/2022   Suspected 2019 novel coronavirus infection 12/18/2020   Swelling    Tendinitis of left rotator cuff 07/14/2022   Thoracic radiculopathy 02/11/2019   Last Assessment & Plan:  Formatting of this note might be different from the original. Plain films of the thoracic spine completed after last appointment show ACDF with mid lower cervical spine with thoracic vertebral body alignment and disc spaces normal.  Responded well to previous oral steroids.  Have discussed possibility of C7-T1 CESI should her symptoms return.   Type 2 diabetes mellitus without complication, without long-term current use of insulin (HCC) 12/17/2020   Last Assessment & Plan:  Formatting of this note might be different from the original. Encourage her today to focus on losing weight, decreasing carbohydrate intake.  Hopefully she can make some improvements here to get this better controlled.   Uncomplicated opioid dependence (HCC) 01/05/2016   Viral syndrome 12/18/2020   Vitamin D deficiency 12/18/2019    Past Surgical History:  Procedure Laterality Date   ABDOMINAL HYSTERECTOMY     FOOT SURGERY     HAND SURGERY     BOTH HANDS   LEFT HEART CATH AND CORONARY ANGIOGRAPHY N/A 08/20/2018   Procedure: LEFT HEART CATH AND CORONARY ANGIOGRAPHY;  Surgeon: Dann Candyce RAMAN, MD;  Location: [facility] INVASIVE CV LAB;  Service: Cardiovascular;  Laterality: N/A;   NECK SURGERIES       Current Medications: Current Meds  Medication Sig   amitriptyline (ELAVIL) 10 MG tablet Take 10 mg by mouth at bedtime.   amLODipine (NORVASC) 10 MG tablet Take 10 mg by mouth every evening.    aspirin  EC 81 MG tablet Take 81 mg by mouth daily.   carisoprodol (SOMA) 350 MG tablet Take 350 mg by mouth daily as needed for muscle spasms.    carvedilol  (COREG ) 25 MG tablet Take 1 tablet (25 mg total) by mouth 2 (two) times daily with a meal.   HYDROcodone-acetaminophen  (NORCO) 10-325 MG per tablet Take 1 tablet by mouth every 6 (six) hours as needed for moderate pain.   KLOR-CON M20 20 MEQ tablet Take 20 mEq by mouth daily at 3 pm.    lisinopril-hydrochlorothiazide (PRINZIDE,ZESTORETIC) 20-12.5 MG per tablet Take 1 tablet by mouth daily.   Multiple Vitamins-Minerals (ADULT GUMMY PO) Take 1 tablet by mouth 2 (two) times a week.   nitroGLYCERIN  (NITROSTAT ) 0.4 MG SL tablet Place 1 tablet (0.4 mg total) under the tongue every 5 (five) minutes as needed for chest pain.   OVER THE COUNTER MEDICATION Take 1 tablet by mouth daily. B12, Calcium  and D3   rosuvastatin  (CRESTOR ) 10 MG tablet Take 10 mg (1 tablet) in the morning and 20 mg (2 tablets) in the evening.     Allergies:   Nitrofurantoin   Social History   Socioeconomic History   Marital status: Widowed    Spouse name: Not on file   Number of children: Not on file   Years of education: Not on file   Highest education level: Not on file  Occupational  History   Not on file  Tobacco Use   Smoking status: Former    Current packs/day: 0.00    Types: Cigarettes    Quit date: 2008    Years since quitting: 17.0   Smokeless tobacco: Never  Substance and Sexual Activity   Alcohol  use: No    Alcohol /week: 0.0 standard drinks of alcohol    Drug use: No   Sexual activity: Not on file  Other Topics Concern   Not on file  Social History Narrative   Not on file   Social Drivers of Health   Financial Resource Strain: Not on file  Food  Insecurity: Not on file  Transportation Needs: Not on file  Physical Activity: Not on file  Stress: Not on file  Social Connections: Not on file     Family History: The patient's family history includes Coronary artery disease in her father; Heart attack in her mother and paternal grandmother; Other in her brother and father; Pneumonia in her mother.  ROS:   Please see the history of present illness.    All other systems reviewed and are negative.  EKGs/Labs/Other Studies Reviewed:    The following studies were reviewed today: I discussed my findings with the patient at length   Recent Labs: 12/23/2022: ALT 19; BUN 16; Creatinine, Ser 0.85; Potassium 4.0; Sodium 141  Recent Lipid Panel    Component Value Date/Time   CHOL 175 09/07/2021 1142   TRIG 204 (H) 09/07/2021 1142   HDL 48 09/07/2021 1142   CHOLHDL 3.6 09/07/2021 1142   LDLCALC 92 09/07/2021 1142   LDLDIRECT 88 12/23/2022 1122    Physical Exam:    VS:  BP 132/88   Pulse (!) 58   Ht 5' 1 (1.549 m)   Wt 218 lb (98.9 kg)   SpO2 96%   BMI 41.19 kg/m     Wt Readings from Last 3 Encounters:  06/21/23 218 lb (98.9 kg)  12/23/22 215 lb (97.5 kg)  11/11/22 216 lb (98 kg)     GEN: Patient is in no acute distress HEENT: Normal NECK: No JVD; No carotid bruits LYMPHATICS: No lymphadenopathy CARDIAC: Hear sounds regular, 2/6 systolic murmur at the apex. RESPIRATORY:  Clear to auscultation without rales, wheezing or rhonchi  ABDOMEN: Soft, non-tender, non-distended MUSCULOSKELETAL:  No edema; No deformity  SKIN: Warm and dry NEUROLOGIC:  Alert and oriented x 3 PSYCHIATRIC:  Normal affect   Signed, Jennifer JONELLE Crape, MD  06/21/2023 4:05 PM    [facility]

## 2023-06-21 NOTE — Patient Instructions (Addendum)
 Medication Instructions:  Your physician has recommended you make the following change in your medication:   Use nitroglycerin  1 tablet placed under the tongue at the first sign of chest pain or an angina attack. 1 tablet may be used every 5 minutes as needed, for up to 15 minutes. Do not take more than 3 tablets in 15 minutes. If pain persist call 911 or go to the nearest ED.   *If you need a refill on your cardiac medications before your next appointment, please call your pharmacy*   Lab Work: Your physician recommends that you have a BMP today in the office.   If you have labs (blood work) drawn today and your tests are completely normal, you will receive your results only by: MyChart Message (if you have MyChart) OR A paper copy in the mail If you have any lab test that is abnormal or we need to change your treatment, we will call you to review the results.   Testing/Procedures:   Your cardiac CT will be scheduled at one of the below locations:   [facility] [address], [address] [place],  [place]  [address]  Main: [phone]   Please follow these instructions carefully (unless otherwise directed):  On the Night Before the Test: Be sure to Drink plenty of water. Do not consume any caffeinated/decaffeinated beverages or chocolate 12 hours prior to your test. Do not take any antihistamines 12 hours prior to your test.   On the Day of the Test: Drink plenty of water until 1 hour prior to the test. Do not eat any food 1 hour prior to test. You may take your regular medications prior to the test.  Take carvedilol  (Coreg ) two hours prior to test. FEMALES- please wear underwire-free bra if available, avoid dresses & tight clothing  After the Test: Drink plenty of water. After receiving IV contrast, you may experience a mild flushed feeling. This is normal. On occasion, you may experience a mild rash up to 24 hours after  the test. This is not dangerous. If this occurs, you can take Benadryl 25 mg and increase your fluid intake. If you experience trouble breathing, this can be serious. If it is severe call 911 IMMEDIATELY. If it is mild, please call our office. If you take any of these medications: Glipizide/Metformin, Avandament, Glucavance, please do not take 48 hours after completing test unless otherwise instructed.  We will call to schedule your test 2-4 weeks out understanding that some insurance companies will need an authorization prior to the service being performed.   For non-scheduling related questions, please contact the cardiac imaging nurse navigator should you have any questions/concerns: Camie Shutter, Cardiac Imaging Nurse Navigator Chantal Requena, Cardiac Imaging Nurse Navigator [facility] Direct Office Dial: [phone]   For scheduling needs, including cancellations and rescheduling, please call Brittany, [phone].   Your next appointment:   9 month(s)  The format for your next appointment:   In Person  Provider:   Jennifer Crape, MD   Other Instructions Cardiac CT Angiogram A cardiac CT angiogram is a procedure to look at the heart and the area around the heart. It may be done to help find the cause of chest pains or other symptoms of heart disease. During this procedure, a substance called contrast dye is injected into the blood vessels in the area to be checked. A large X-ray machine, called a CT scanner, then takes  detailed pictures of the heart and the surrounding area. The procedure is also sometimes called a coronary CT angiogram, coronary artery scanning, or CTA. A cardiac CT angiogram allows the health care provider to see how well blood is flowing to and from the heart. The health care provider will be able to see if there are any problems, such as: Blockage or narrowing of the coronary arteries in the heart. Fluid around the heart. Signs of  weakness or disease in the muscles, valves, and tissues of the heart. Tell a health care provider about: Any allergies you have. This is especially important if you have had a previous allergic reaction to contrast dye. All medicines you are taking, including vitamins, herbs, eye drops, creams, and over-the-counter medicines. Any blood disorders you have. Any surgeries you have had. Any medical conditions you have. Whether you are pregnant or may be pregnant. Any anxiety disorders, chronic pain, or other conditions you have that may increase your stress or prevent you from lying still. What are the risks? Generally, this is a safe procedure. However, problems may occur, including: Bleeding. Infection. Allergic reactions to medicines or dyes. Damage to other structures or organs. Kidney damage from the contrast dye that is used. Increased risk of cancer from radiation exposure. This risk is low. Talk with your health care provider about: The risks and benefits of testing. How you can receive the lowest dose of radiation. What happens before the procedure? Wear comfortable clothing and remove any jewelry, glasses, dentures, and hearing aids. Follow instructions from your health care provider about eating and drinking. This may include: For 12 hours before the procedure -- avoid caffeine. This includes tea, coffee, soda, energy drinks, and diet pills. Drink plenty of water or other fluids that do not have caffeine in them. Being well hydrated can prevent complications. For 4-6 hours before the procedure -- stop eating and drinking. The contrast dye can cause nausea, but this is less likely if your stomach is empty. Ask your health care provider about changing or stopping your regular medicines. This is especially important if you are taking diabetes medicines, blood thinners, or medicines to treat problems with erections (erectile dysfunction). What happens during the procedure?  Hair on your  chest may need to be removed so that small sticky patches called electrodes can be placed on your chest. These will transmit information that helps to monitor your heart during the procedure. An IV will be inserted into one of your veins. You might be given a medicine to control your heart rate during the procedure. This will help to ensure that good images are obtained. You will be asked to lie on an exam table. This table will slide in and out of the CT machine during the procedure. Contrast dye will be injected into the IV. You might feel warm, or you may get a metallic taste in your mouth. You will be given a medicine called nitroglycerin . This will relax or dilate the arteries in your heart. The table that you are lying on will move into the CT machine tunnel for the scan. The person running the machine will give you instructions while the scans are being done. You may be asked to: Keep your arms above your head. Hold your breath. Stay very still, even if the table is moving. When the scanning is complete, you will be moved out of the machine. The IV will be removed. The procedure may vary among health care providers and hospitals. What can I  expect after the procedure? After your procedure, it is common to have: A metallic taste in your mouth from the contrast dye. A feeling of warmth. A headache from the nitroglycerin . Follow these instructions at home: Take over-the-counter and prescription medicines only as told by your health care provider. If you are told, drink enough fluid to keep your urine pale yellow. This will help to flush the contrast dye out of your body. Most people can return to their normal activities right after the procedure. Ask your health care provider what activities are safe for you. It is up to you to get the results of your procedure. Ask your health care provider, or the department that is doing the procedure, when your results will be ready. Keep all  follow-up visits as told by your health care provider. This is important. Contact a health care provider if: You have any symptoms of allergy to the contrast dye. These include: Shortness of breath. Rash or hives. A racing heartbeat. Summary A cardiac CT angiogram is a procedure to look at the heart and the area around the heart. It may be done to help find the cause of chest pains or other symptoms of heart disease. During this procedure, a large X-ray machine, called a CT scanner, takes detailed pictures of the heart and the surrounding area after a contrast dye has been injected into blood vessels in the area. Ask your health care provider about changing or stopping your regular medicines before the procedure. This is especially important if you are taking diabetes medicines, blood thinners, or medicines to treat erectile dysfunction. If you are told, drink enough fluid to keep your urine pale yellow. This will help to flush the contrast dye out of your body. This information is not intended to replace advice given to you by your health care provider. Make sure you discuss any questions you have with your health care provider. Document Revised: 01/23/2019 Document Reviewed: 01/23/2019 Elsevier Patient Education  2020 [employer].

## 2023-06-22 LAB — BASIC METABOLIC PANEL
BUN/Creatinine Ratio: 19 (ref 12–28)
BUN: 15 mg/dL (ref 8–27)
CO2: 22 mmol/L (ref 20–29)
Calcium: 9.4 mg/dL (ref 8.7–10.3)
Chloride: 103 mmol/L (ref 96–106)
Creatinine, Ser: 0.77 mg/dL (ref 0.57–1.00)
Glucose: 94 mg/dL (ref 70–99)
Potassium: 4.3 mmol/L (ref 3.5–5.2)
Sodium: 141 mmol/L (ref 134–144)
eGFR: 84 mL/min/{1.73_m2} (ref >59)

## 2023-07-13 ENCOUNTER — Encounter (HOSPITAL_COMMUNITY): Payer: Self-pay

## 2023-07-18 ENCOUNTER — Ambulatory Visit (HOSPITAL_BASED_OUTPATIENT_CLINIC_OR_DEPARTMENT_OTHER)
Admission: RE | Admit: 2023-07-18 | Discharge: 2023-07-18 | Disposition: A | Discharge status: Home / Self Care | Payer: Medicare PPO | Source: Ambulatory Visit | Attending: Cardiology | Admitting: Cardiology

## 2023-07-18 ENCOUNTER — Encounter (HOSPITAL_BASED_OUTPATIENT_CLINIC_OR_DEPARTMENT_OTHER): Payer: Self-pay

## 2023-07-18 DIAGNOSIS — I259 Chronic ischemic heart disease, unspecified: Secondary | ICD-10-CM

## 2023-07-18 DIAGNOSIS — R931 Abnormal findings on diagnostic imaging of heart and coronary circulation: Secondary | ICD-10-CM | POA: Insufficient documentation

## 2023-07-18 MED ORDER — NITROGLYCERIN 0.4 MG SL SUBL
0.8000 mg | SUBLINGUAL_TABLET | Freq: Once | SUBLINGUAL | Status: AC
Start: 2023-07-18 — End: 2023-07-18
  Administered 2023-07-18: 0.8 mg via SUBLINGUAL

## 2023-07-18 MED ORDER — IOHEXOL 350 MG/ML SOLN
100.0000 mL | Freq: Once | INTRAVENOUS | Status: AC | PRN
  Administered 2023-07-18: 95 mL via INTRAVENOUS

## 2023-07-18 NOTE — Progress Notes (Signed)
Patient presents for a cardiac CT and tolerated procedure well. Patient maintained acceptable vital signs, denies symptoms.  Patient ambulated out of department with a steady gait.

## 2023-07-19 ENCOUNTER — Other Ambulatory Visit: Payer: Self-pay | Admitting: Cardiology

## 2023-07-19 ENCOUNTER — Ambulatory Visit (HOSPITAL_BASED_OUTPATIENT_CLINIC_OR_DEPARTMENT_OTHER)
Admission: RE | Admit: 2023-07-19 | Discharge: 2023-07-19 | Disposition: A | Discharge status: Home / Self Care | Payer: Medicare PPO | Source: Ambulatory Visit | Attending: Cardiology | Admitting: Cardiology

## 2023-07-19 DIAGNOSIS — R931 Abnormal findings on diagnostic imaging of heart and coronary circulation: Secondary | ICD-10-CM | POA: Diagnosis not present

## 2023-07-19 NOTE — Progress Notes (Unsigned)
 FFR order

## 2023-07-24 NOTE — Progress Notes (Signed)
Cardiology Office Note:    Date:  07/25/2023   ID:  Marie Hughes, DOB 16-Feb-1956, MRN [id number]  PCP:  Malva Limes, FNP   [facility] Providers Cardiologist:  Garwin Brothers, MD     Referring MD: Malva Limes, FNP   CC: follow up CAD  History of Present Illness:    Marie Hughes is a 68 y.o. female with a hx of nonobstructive CAD, hypertension, hyperlipidemia, DM 2, obesity, LBBB, depression.  Echocardiogram on 05/28/2018 revealed an EF of 55 to 60%, mild MR, mild to moderate left atrial enlargement. Left heart cath on 08/20/2018 revealed nonobstructive CAD. She wore a monitor in June 2021, predominant underlying rhythm was sinus, unremarkable. Echo 12/06/2022 EF 55 to 60%, mild concentric LVH, grade 1 DD, mild MR, mild dilatation of ascending aorta 38 mm 7-day ZIO 12/01/2022 average heart rate 65 bpm predominant I rhythm was sinus BBB/IVCD was present, 2 runs of SVT, isolated Ve's 07/18/2023 ccta calcium score of 766, 87th percentile, total plaque volume with severe however FFR demonstrated low likelihood of hemodynamic significance  Most recently she was evaluated for follow-up of her CAD, was doing well, recently enrolled in a healthy weight and wellness program and was working out routinely at the gym.  She was bothered by her heart rate fluctuating and some palpitations also DOE.  We repeated an echocardiogram which revealed an EF of 55 to 60%, mild concentric LVH, grade 1 DD.  7-day ZIO was arranged which was predominantly normal, she did have isolated VE's and a few runs of SVT.  Evaluated by Dr. Tomie China on 06/21/2023, she endorsed episodes of occasional chest tightness that radiated to her neck.  A coronary CTA was arranged which was completed on 07/18/2023 which revealed a calcium score of 766, 87th percentile, total plaque volume with severe however FFR demonstrated low likelihood of hemodynamically significant stenosis.  She presents today to discuss her  abnormal coronary CTA.  She has vague symptoms including fatigue that she worries could be associated with her coronary artery disease, and was understandably worried after receiving the results of her coronary CTA.  She has episodes of chest pain that do not sound to be consistent with angina and typically occur at rest, again it is hard for her to decipher.  She does not notice chest pain when she is exerting herself or when she works out, she typically works out at least 4-5 times a month.  She is working on losing weight however this has been very difficult to do. She denies  palpitations, dyspnea, pnd, orthopnea, n, v, dizziness, syncope, edema, weight gain, or early satiety.    Past Medical History:  Diagnosis Date   Acute cystitis without hematuria 07/30/2019   Last Assessment & Plan:  Formatting of this note might be different from the original. UA most consistent with uncomplicated UTI. No red flag features to suggest need for imaging, special testing or urgent specialty consultation at this time. Antibiotic started per visit orders. Will send UA for culture to confirm sensitivity to abx given. Recommend f/u with PCP or return to clinic or ED for worse   Arthralgia of knee, left 05/17/2019   Last Assessment & Plan:  Formatting of this note might be different from the original. Recently provided intra-articular injection is done very well, could repeat in the future if persistent symptoms.   Arthropathy, unspecified 11/01/2022   Last Assessment & Plan: Formatting of this note might be different from the original. Problem  complexity: Chronic problem/illness, stable Assessment:  she endorses chronic pain of joints in multiple sites. Plan: labs and will advise accordingly.     Bilateral carotid bruits 10/17/2019   Bilateral leg cramps 12/07/2021   Last Assessment & Plan: Formatting of this note might be different from the original. New to provider. Problem complexity: Chronic problem/illness, with  progression Assessment:  Nocturnal BLE cramps for several months. This is likely multifactoral given her spinal disease and possible hypokalemia. Plan: labs today and will advise accordingly. Supportive care discussed.     Bladder problem    Cervical post-laminectomy syndrome 08/21/2017   Last Assessment & Plan:  Formatting of this note might be different from the original. Multilevel cervical fusion with continued neck and right upper extremity radicular pain.  Stable at this time without any progression, we will hold on any further imaging at this time.   Chest pain 12/21/2017   Last Assessment & Plan:  Intermittent. Non-radiating. No N/V/D. No vision changes.  Last Assessment & Plan:  Formatting of this note might be different from the original. No complaints of chest pain since hospital discharge.   Chronic foot pain, right 01/24/2020   Last Assessment & Plan:  Formatting of this note might be different from the original. 68 year old female with chronic pain of multiple etiologies, at this time most limiting complaint is right foot pain secondary to refractory plantar fasciitis status post surgical intervention.  She is followed by Podiatry.  Today I recommended obtaining a more appropriate supportive footwear, we have also discu   Chronic left shoulder pain 06/19/2020   Last Assessment & Plan:  Formatting of this note might be different from the original. Recently referred to orthopedics by PCP, several weeks status post intra-articular injection with excellent benefit.  I did let patient know that I am happy to repeat that procedure in the future should she need it.   Chronic left-sided thoracic back pain 07/14/2022   Chronic pain of right ankle 06/19/2020   Chronic right shoulder pain 11/12/2018   Last Assessment & Plan:  Formatting of this note might be different from the original. Today we will take plain films of the shoulder to further evaluate etiology of her pain.  Discussed with patient  that it is very likely radicular in nature, as I do not have any findings on exam suspicious of true shoulder etiology.   Chronically on opiate therapy 12/20/2016   Last Assessment & Plan:  Risks of this type of medication including the risk of addiction and, if misused, of death are reviewed with the patient.  The patient feels the benefits of pain relief and increased ability to engage in self-care activities improve the quality of life to outweigh these risks.  Last Assessment & Plan:  Formatting of this note might be different from the original. Will cont   Class 2 drug-induced obesity with serious comorbidity and body mass index (BMI) of 39.0 to 39.9 in adult 12/21/2017   Last Assessment & Plan:  Formatting of this note might be different from the original. BMI Assessment: Current Body mass index is 39.91 kg/m.  Patient BMI currently is above average (>25 kg/m2); BMI follow up plan is completed . Current barriers to healthy weight management include none.  BMI Plan:  Today, Ahlayah and I have discussed methods to help address her current weight.   General weight l   Class 2 severe obesity due to excess calories with serious comorbidity and body mass index (BMI) of 39.0 to  39.9 in adult [facility]) 06/19/2020   Last Assessment & Plan:  Formatting of this note might be different from the original. BMI Assessment: Current Body mass index is 39.68 kg/m.  Patient BMI currently is above average (>25 kg/m2); BMI follow up plan is completed . Current barriers to healthy weight management include pain.  BMI Plan:  Today, Antwan and I have discussed methods to help address her current weight.   General weight l   Complete tear of rotator cuff 07/14/2022   Coronary artery disease involving native coronary artery of native heart with unstable angina pectoris (HCC) 09/12/2018   Last Assessment & Plan:  Formatting of this note might be different from the original. She is followed by cardiology and has an upcoming  appointment with them.   Advised to continue current medications.   DDD (degenerative disc disease), lumbar 08/21/2017   Last Assessment & Plan:  Stble. No exacerbation of symptoms.  Last Assessment & Plan:  Formatting of this note might be different from the original. Chronic back pain and bilateral lower extremity radicular symptoms that appear to correspond to L4/5 distribution. Review of most recent lumbar MRI from 2013 shows multilevel degenerative changes most prominent at L4-5 where there is mild spinal steno   Degenerative cervical spinal stenosis 01/05/2016   Last Assessment & Plan:  Formatting of this note might be different from the original. We will continue current medications.  Patient understands she may benefit percutaneous interventions.  Patient has had previous cervical spine surgeries.   Depression    Diabetes mellitus due to underlying condition with unspecified complications (HCC) 04/12/2021   Dietary counseling 04/17/2020   Diverticulosis 07/14/2022   Dizziness 12/18/2020   Last Assessment & Plan:  Formatting of this note might be different from the original. Patient complains of dizziness and overall not feeling well.  She does complain of feelings of hot and cold as well as sweats.  She admits working at the [employer] is reunion in the direct heat.  She did this for several days.  She feels she may be dehydrated somewhat.  She has no focal neuro def   Dyspnea on exertion 04/12/2020   Dysuria 07/24/2019   Last Assessment & Plan:  Formatting of this note might be different from the original. Assessment  Pain intermittent in the suprapubic. Mild backpain. Symptoms "come and go", then come back. Hx of kidney stone.  Reports weak urine stream Afebrile, no chills This may be a recurrence of kidney stone UA +wbc 75, will culture  Plan  Will start Flomax and await the urine culture   Elevated blood pressure reading 07/29/2021   Last Assessment & Plan:   Formatting of  this note might be different from the original.  Blood pressure elevated in clinic today.  Patient will continue to follow-up with his primary care provider for further management.     Encounter for screening mammogram for breast cancer 12/18/2019   Essential hypertension 01/05/2016   Last Assessment & Plan:  Formatting of this note might be different from the original. Blood pressure elevated today-follow-up with primary care for further management.   Exposure to 2019 novel coronavirus 07/01/2019   Last Assessment & Plan:  Formatting of this note might be different from the original. POCT Sars-Cov-2 Sophia NEGATIVE POCT Sars-COV-2 Aptima-COV  I have explained to the patient they must quarantine according to [facility] current guideline. They are encouraged to protect others in the household by wearing a mask, covering cough, and  cleansing any area used by the patient, and if possible quarantine in    Heart disease 12/21/2017   Last Assessment & Plan:  No reports of chest pain in approximately 8 months. Will refill Nitro SL tabs.  Last Assessment & Plan:  Formatting of this note might be different from the original. Complaining of more frequent episodes of dyspnea on exertion, and bilateral leg swelling which are chronic concerns for her.  Advise she follow-up with her PCP in regards to these concerns for further evaluat   Hematuria of unknown etiology 07/14/2022   Last Assessment & Plan: Formatting of this note might be different from the original. Patient complaining of left flank pain that is chronic.  However, we obtained a urine to rule out an underlying cystitis or pyelonephritis.  Her urine did show some hematuria.  We will send for microscopic analysis as well as culture.     History of COVID-19 04/12/2022   Last Assessment & Plan: Formatting of this note might be different from the original. Acute illness that is progressing. Diagnosis COVID 19 per rapid testing. Plan Discussed the importance of  avoiding unnecessary antibiotic therapy. Suggested symptomatic OTC remedies. OTC antihistamine for rash. Discussed paxlovid. She defers at this time and requests to manage without pharmacotherapy. She has no    Hypertension    Hypertensive urgency 01/05/2016   Last Assessment & Plan:  Formatting of this note might be different from the original. Blood pressure is controlled today. 128/70. Advise to continue monitoring. Notify office for elevation. Continue current medications and f/u with cardiology.   IFG (impaired fasting glucose) 12/21/2017   Last Assessment & Plan:  Monitoring with blood work. POC according to results.'  Last Assessment & Plan:  Formatting of this note is different from the original. Lab Results  Component Value Date   HGBA1C 6.2 12/21/2017   Prediabetic. Monitoring with BW  Monitoring with blood work  Change in diet including a whole plant based diet, limiting meats especially red meat encouraged.   Avoiding unhea   Impingement syndrome of left shoulder 07/14/2022   Lack of energy 11/01/2022   Last Assessment & Plan: Formatting of this note might be different from the original. Undiagnosed problem with uncertain prognosis. Assessment: complains of chronic fatigue. Plan: labs and will advise accordingly     Left bundle branch block (LBBB) 10/17/2019   Formatting of this note might be different from the original. Noted to have LBBB in old records. LBBB mentioned in cardiologist's office note 10/17/19.  Last Assessment & Plan:  Formatting of this note might be different from the original. This is chronic and was noted on recent stress test. She is following with cardiology. Advised to proceed with next appointment.  She is asymptomatic today.   Left upper quadrant pain 08/03/2021   Last Assessment & Plan:   Formatting of this note might be different from the original.  Problem complexity: Chronic problem/illness, with progression     Assessment:  Left upper quadrant pain for several  months. She is fearful may have pancreatic cancer due to significant family hx of similar presentation. She is scheduled for MRI of the thoracic spine this week to evaluate for issues of the spin   Localized swelling of both lower legs 04/13/2021   Last Assessment & Plan:   Formatting of this note might be different from the original.  Patient complains of an approximate 2 month onset of bilateral lower extremity swelling.  However, upon exam I did not  note any changes from previous exam.  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  There was no pitting.  Weight is stable without significant weight gain.  Her lungs were clear to ausc   Long term (current) use of opiate analgesic 12/20/2016   Last Assessment & Plan: Formatting of this note might be different from the original. Patient and I have discussed the hazardous effects of continued opiate pain medication usage. Risks and benefits of above medications including but not limited to possibility of hyperalgesia, respiratory depression, sedation, and even death were discussed with the patient who expressed an understanding. Patient i   Long term use of drug 12/18/2019   Lumbar radiculitis 01/05/2016   Last Assessment & Plan:  Formatting of this note might be different from the original. Patient will continue with current medications.  Patient understands she may benefit percutaneous interventions.   Mass of right foot 07/14/2022   Mixed hyperlipidemia 01/05/2016   Last Assessment & Plan:  Formatting of this note is different from the original. Problem Complexity:  Chronic problem/illness, stable  Pertinent Data: LDL Calculated (mg/dL)  Date Value  40/98/1191 48   Cholesterol (mg/dl)  Date Value  47/82/9562 135   HDL (mg/dl)  Date Value  13/01/6577 34.4 (L)   Triglycerides (mg/dl)  Date Value  46/96/2952 261 (H)   AST (U/L)  Date Value  12/18/2019 20   ALT (   Morbid obesity (HCC) 04/16/2018   Muscle pain    Muscle spasm of back 08/21/2017    Last Assessment & Plan:  Formatting of this note might be different from the original. Continue p.r.n. Soma   MVP (mitral valve prolapse) 07/14/2022   Nausea 12/18/2020   Nephrolithiasis 07/14/2022   Nocturia 07/14/2022   Obesity due to excess calories 01/05/2016   Last Assessment & Plan:  Formatting of this note might be different from the original. Diet was discussed and reviewed with the patient. Modify diet to eliminate bad fats (fast food, junk foods) and increase fiber intake. Regular exercise several times per week as appropriate, along with proper diet will help to improve overall health. Discussion concerning the health risk of obesity educated. Edu   Otalgia, left ear 12/18/2019   Last Assessment & Plan:  Formatting of this note might be different from the original. Physical examination revealed a normal exam.  No evidence of infection.  No evidence of cerumen impaction.   Other chronic pain 08/21/2017   Last Assessment & Plan:  ALEISA HOWK is a 68 y.o.  year old female with Low back pain, Neck pain, due to Multilevel multifactorial degenerative changes in the lumbar and cervical spine, resulting in lower extremity radiculopathy bilaterally, in the setting of previous surgery in the cervical spine. Today I will refill the patient's current medications of Hydrocodone 10/325 mg PO PRN qid, and S   Pain and swelling of lower extremity, right 11/12/2018   Last Assessment & Plan:  Formatting of this note might be different from the original. Due to patient's recent onset of worsening lower extremity pain, with increased unilateral swelling and diffuse calf tenderness of the right extremity, we recommend she go for a right lower extremity ultrasound to rule out a DVT.   Pain medication agreement 08/10/2018   Last Assessment & Plan:  There is a current opioid agreement on file within the clinic and urine drug screens will be collected as needed to be in compliance with clinic policies.  Last  Assessment & Plan:  Formatting of this note might be different from the original. Review of prior UDS results are consistent with regimen.  [place] controlled substance registry reviewed and is consistent wi   Plantar fasciitis, right 07/14/2022   Postmenopausal state 10/11/2021   Pre-syncope 04/12/2020   Last Assessment & Plan:  Formatting of this note might be different from the original. No further episodes since hospital discharge.   Recurrent cystitis 12/18/2019   Recurrent major depressive disorder, in partial remission (HCC) 01/05/2016   Last Assessment & Plan:  Formatting of this note might be different from the original. This is a chronic problem and is stable.  She denies suicidal or homicidal ideations.  Advised to continue current medications.   Recurrent UTI 07/14/2022   Suspected 2019 novel coronavirus infection 12/18/2020   Swelling    Tendinitis of left rotator cuff 07/14/2022   Thoracic radiculopathy 02/11/2019   Last Assessment & Plan:  Formatting of this note might be different from the original. Plain films of the thoracic spine completed after last appointment show ACDF with mid lower cervical spine with thoracic vertebral body alignment and disc spaces normal.  Responded well to previous oral steroids.  Have discussed possibility of C7-T1 CESI should her symptoms return.   Type 2 diabetes mellitus without complication, without long-term current use of insulin (HCC) 12/17/2020   Last Assessment & Plan:  Formatting of this note might be different from the original. Encourage her today to focus on losing weight, decreasing carbohydrate intake.  Hopefully she can make some improvements here to get this better controlled.   Uncomplicated opioid dependence (HCC) 01/05/2016   Viral syndrome 12/18/2020   Vitamin D deficiency 12/18/2019    Past Surgical History:  Procedure Laterality Date   ABDOMINAL HYSTERECTOMY     FOOT SURGERY     HAND SURGERY     BOTH HANDS   LEFT  HEART CATH AND CORONARY ANGIOGRAPHY N/A 08/20/2018   Procedure: LEFT HEART CATH AND CORONARY ANGIOGRAPHY;  Surgeon: Corky Crafts, MD;  Location: [facility] INVASIVE CV LAB;  Service: Cardiovascular;  Laterality: N/A;   NECK SURGERIES      Current Medications: Current Meds  Medication Sig   amitriptyline (ELAVIL) 10 MG tablet Take 10 mg by mouth at bedtime.   amLODipine (NORVASC) 10 MG tablet Take 10 mg by mouth every evening.    aspirin EC 81 MG tablet Take 81 mg by mouth daily.   carisoprodol (SOMA) 350 MG tablet Take 350 mg by mouth daily as needed for muscle spasms.    carvedilol (COREG) 25 MG tablet Take 1 tablet (25 mg total) by mouth 2 (two) times daily with a meal.   Coenzyme Q10 100 MG capsule Take 100 mg by mouth daily.   ezetimibe (ZETIA) 10 MG tablet Take 1 tablet (10 mg total) by mouth daily.   HYDROcodone-acetaminophen (NORCO) 10-325 MG per tablet Take 1 tablet by mouth every 6 (six) hours as needed for moderate pain.   isosorbide mononitrate (IMDUR) 30 MG 24 hr tablet Take 1 tablet (30 mg total) by mouth daily.   KLOR-CON M20 20 MEQ tablet Take 20 mEq by mouth daily at 3 pm.    lisinopril-hydrochlorothiazide (PRINZIDE,ZESTORETIC) 20-12.5 MG per tablet Take 1 tablet by mouth daily.   Multiple Vitamins-Minerals (ADULT GUMMY PO) Take 1 tablet by mouth 2 (two) times a week.   nitroGLYCERIN (NITROSTAT) 0.4 MG SL tablet Place 1 tablet (0.4 mg total) under the tongue every 5 (five) minutes as needed for chest  pain.   OVER THE COUNTER MEDICATION Take 1 tablet by mouth daily. B12, Calcium and D3   rosuvastatin (CRESTOR) 10 MG tablet Take 10 mg (1 tablet) in the morning and 20 mg (2 tablets) in the evening.     Allergies:   Nitrofurantoin   Social History   Socioeconomic History   Marital status: Widowed    Spouse name: Not on file   Number of children: Not on file   Years of education: Not on file   Highest education level: Not on file  Occupational History   Not on file   Tobacco Use   Smoking status: Former    Current packs/day: 0.00    Types: Cigarettes    Quit date: 2008    Years since quitting: 17.1   Smokeless tobacco: Never  Substance and Sexual Activity   Alcohol use: No    Alcohol/week: 0.0 standard drinks of alcohol   Drug use: No   Sexual activity: Not on file  Other Topics Concern   Not on file  Social History Narrative   Not on file   Social Drivers of Health   Financial Resource Strain: Not on file  Food Insecurity: Not on file  Transportation Needs: Not on file  Physical Activity: Not on file  Stress: Not on file  Social Connections: Not on file     Family History: The patient's family history includes Coronary artery disease in her father; Heart attack in her mother and paternal grandmother; Other in her brother and father; Pneumonia in her mother.  ROS:   Please see the history of present illness.     All other systems reviewed and are negative.  EKGs/Labs/Other Studies Reviewed:    The following studies were reviewed today: Cardiac Studies & Procedures   ______________________________________________________________________________________________ CARDIAC CATHETERIZATION  CARDIAC CATHETERIZATION 08/20/2018  Narrative  Prox RCA lesion is 25% stenosed.  Mid Cx lesion is 40% stenosed.  Prox LAD lesion is 25% stenosed.  Ost 1st Diag lesion is 50% stenosed.  LV end diastolic pressure is moderately elevated.  The left ventricular ejection fraction is 50-55% by visual estimate.  The left ventricular systolic function is normal.  There is no aortic valve stenosis.  Nonobstructive CAD.  Moderately increased LVEDP.  Consider diuretic to help with shortness of breath.  Continue with aggressive risk factor modification including weight loss.  Findings Coronary Findings Diagnostic  Dominance: Right  Left Anterior Descending Prox LAD lesion is 25% stenosed.  First Diagonal Branch Ost 1st Diag lesion is 50%  stenosed.  Left Circumflex Vessel is small. Mid Cx lesion is 40% stenosed.  Right Coronary Artery Prox RCA lesion is 25% stenosed.  Intervention  No interventions have been documented.   STRESS TESTS  MYOCARDIAL PERFUSION IMAGING 05/16/2018  Narrative  The left ventricular ejection fraction is normal (55-65%).  Nuclear stress EF: 58%.  There was no ST segment deviation noted during stress.  The study is normal.  This is a low risk study.  Thinning of the anteroseptum at mid and apical level likely from LBBB No ischemia EF 58% with no RWMAls low risk study   ECHOCARDIOGRAM  ECHOCARDIOGRAM COMPLETE 12/06/2022  Narrative ECHOCARDIOGRAM REPORT    Patient Name:   DIEDRA Sobh Date of Exam: 12/06/2022 Medical Rec #:  [id number]     Height:       61.5 in Accession #:    [id number]    Weight:       216.0 lb Date of Birth:  05-Nov-1955  BSA:          1.963 m Patient Age:    66 years      BP:           120/80 mmHg Patient Gender: F             HR:           61 bpm. Exam Location:  [facility]  Procedure: 2D Echo, Cardiac Doppler, Color Doppler and Strain Analysis  Indications:    Shortness of breath [R06.02 (ICD-10-CM)]  History:        Patient has prior history of Echocardiogram examinations, most recent 05/28/2018. CAD, Arrythmias:LBBB; Risk Factors:Hypertension, Dyslipidemia and Diabetes.  Sonographer:    Margreta Journey RDCS Referring Phys: [phone] Maree Ainley C Taccara Bushnell  IMPRESSIONS   1. Left ventricular ejection fraction, by estimation, is 55 to 60%. The left ventricle has normal function. The left ventricle has no regional wall motion abnormalities. There is mild concentric left ventricular hypertrophy. Left ventricular diastolic parameters are consistent with Grade I diastolic dysfunction (impaired relaxation). The average left ventricular global longitudinal strain is -17.0 %. 2. Right ventricular systolic function is normal. The right ventricular size is normal.  There is normal pulmonary artery systolic pressure. 3. The mitral valve is normal in structure. Mild mitral valve regurgitation. No evidence of mitral stenosis. 4. The aortic valve is normal in structure. Aortic valve regurgitation is not visualized. No aortic stenosis is present. 5. Aortic Normal DTA. There is mild dilatation of the ascending aorta, measuring 38 mm. 6. The inferior vena cava is normal in size with greater than 50% respiratory variability, suggesting right atrial pressure of 3 mmHg.  FINDINGS Left Ventricle: Left ventricular ejection fraction, by estimation, is 55 to 60%. The left ventricle has normal function. The left ventricle has no regional wall motion abnormalities. The average left ventricular global longitudinal strain is -17.0 %. The left ventricular internal cavity size was normal in size. There is mild concentric left ventricular hypertrophy. Left ventricular diastolic parameters are consistent with Grade I diastolic dysfunction (impaired relaxation). Normal left ventricular filling pressure.  Right Ventricle: The right ventricular size is normal. No increase in right ventricular wall thickness. Right ventricular systolic function is normal. There is normal pulmonary artery systolic pressure. The tricuspid regurgitant velocity is 2.41 m/s, and with an assumed right atrial pressure of 3 mmHg, the estimated right ventricular systolic pressure is 26.2 mmHg.  Left Atrium: Left atrial size was normal in size.  Right Atrium: Right atrial size was normal in size.  Pericardium: There is no evidence of pericardial effusion.  Mitral Valve: The mitral valve is normal in structure. Mild mitral valve regurgitation. No evidence of mitral valve stenosis.  Tricuspid Valve: The tricuspid valve is normal in structure. Tricuspid valve regurgitation is mild . No evidence of tricuspid stenosis.  Aortic Valve: The aortic valve is normal in structure. Aortic valve regurgitation is not  visualized. No aortic stenosis is present. Aortic valve mean gradient measures 6.3 mmHg. Aortic valve peak gradient measures 12.0 mmHg. Aortic valve area, by VTI measures 2.38 cm.  Pulmonic Valve: The pulmonic valve was normal in structure. Pulmonic valve regurgitation is not visualized. No evidence of pulmonic stenosis.  Aorta: The aortic root is normal in size and structure, the aortic arch was not well visualized and Normal DTA. There is mild dilatation of the ascending aorta, measuring 38 mm.  Venous: A normal flow pattern is recorded from the right upper pulmonary vein. The inferior vena cava is normal  in size with greater than 50% respiratory variability, suggesting right atrial pressure of 3 mmHg.  IAS/Shunts: No atrial level shunt detected by color flow Doppler.   LEFT VENTRICLE PLAX 2D LVIDd:         5.00 cm   Diastology LVIDs:         3.50 cm   LV e' medial:    7.83 cm/s LV PW:         1.10 cm   LV E/e' medial:  12.4 LV IVS:        1.10 cm   LV e' lateral:   11.00 cm/s LVOT diam:     2.00 cm   LV E/e' lateral: 8.8 LV SV:         93 LV SV Index:   47        2D Longitudinal Strain LVOT Area:     3.14 cm  2D Strain GLS Avg:     -17.0 %   RIGHT VENTRICLE             IVC RV Basal diam:  3.40 cm     IVC diam: 1.80 cm RV Mid diam:    2.70 cm RV S prime:     12.50 cm/s TAPSE (M-mode): 2.7 cm  LEFT ATRIUM             Index        RIGHT ATRIUM           Index LA diam:        4.90 cm 2.50 cm/m   RA Area:     17.20 cm LA Vol (A2C):   85.7 ml 43.65 ml/m  RA Volume:   42.10 ml  21.44 ml/m LA Vol (A4C):   61.2 ml 31.17 ml/m LA Biplane Vol: 73.3 ml 37.33 ml/m AORTIC VALVE AV Area (Vmax):    2.22 cm AV Area (Vmean):   2.22 cm AV Area (VTI):     2.38 cm AV Vmax:           173.00 cm/s AV Vmean:          114.000 cm/s AV VTI:            0.390 m AV Peak Grad:      12.0 mmHg AV Mean Grad:      6.3 mmHg LVOT Vmax:         122.00 cm/s LVOT Vmean:        80.500 cm/s LVOT VTI:           0.295 m LVOT/AV VTI ratio: 0.76  AORTA Ao Root diam: 3.10 cm Ao Asc diam:  3.80 cm Ao Desc diam: 2.10 cm  MITRAL VALVE               TRICUSPID VALVE MV Area (PHT): 3.53 cm    TR Peak grad:   23.2 mmHg MV Decel Time: 215 msec    TR Vmax:        241.00 cm/s MR Peak grad: 37.0 mmHg MR Vmax:      304.00 cm/s  SHUNTS MV E velocity: 97.20 cm/s  Systemic VTI:  0.30 m MV A velocity: 90.10 cm/s  Systemic Diam: 2.00 cm MV E/A ratio:  1.08  Norman Herrlich MD Electronically signed by Norman Herrlich MD Signature Date/Time: 12/06/2022/4:56:14 PM    Final    MONITORS  LONG TERM MONITOR (3-14 DAYS) 11/25/2022  Narrative Patch Wear Time:  8 days and 6 hours (2024-05-31T11:48:53-0400 to 2024-06-08T18:23:30-0400)  Patient had  a min HR of 47 bpm, max HR of 158 bpm, and avg HR of 65 bpm.  Predominant underlying rhythm was Sinus Rhythm. Bundle Branch Block/IVCD was present.  2 Supraventricular Tachycardia runs occurred, the run with the fastest interval lasting 10 beats with a max rate of 158 bpm, the longest lasting 14 beats with an avg rate of 118 bpm. Isolated SVEs were rare (<1.0%), SVE Couplets were rare (<1.0%), and SVE Triplets were rare (<1.0%).  Isolated VEs were rare (<1.0%, 1284), VE Couplets were rare (<1.0%, 80), and VE Triplets were rare (<1.0%, 7).  Impression: Unremarkable event monitor with brief atrial runs noted.   CT SCANS  CT CORONARY MORPH W/CTA COR W/SCORE 07/18/2023  Narrative CLINICAL DATA:  CP  EXAM: Cardiac/Coronary  CTA  TECHNIQUE: The patient was scanned on a GE Apex scanner.  FINDINGS: A 120 kV prospective scan was triggered in the descending thoracic aorta at 111 HU's. Axial non-contrast 3 mm slices were carried out through the heart. The data set was analyzed on a dedicated work station and scored using the Agatson method. Gantry rotation speed was 250 msecs and collimation was .6 mm. No beta blockade and 0.8 mg of sl NTG was given. The 3D  data set was reconstructed at 75 % of the R-R cycle. Diastolic phases were analyzed on a dedicated work station using MPR, MIP and VRT modes. The patient received 80 cc of contrast.  Aorta:  Normal size.  No calcifications.  No dissection.  Aortic Valve:  Trileaflet.  No calcifications.  Coronary Arteries:  Normal coronary origin.  Right dominance.  RCA is a large dominant artery that gives rise to PDA and PLA. There is calcified plaque in the proximal portion of the RCA with mild stenosis of 25-49%. In the distal portion of RCA there are few calcified plaques with minimal stenosis of 0-24%.  Left main is a large artery that gives rise to LAD and LCX arteries as well as moderate size Intermediate Branch.  LAD is a large vessel that has calcified plaque in the proximal portion extending to mid portion with moderate stenosis of 50-69%. This artery gives rise to moderate size Da and small D2.  Intermediate Branch has calcified plaque in its proximal portion with mild stenosis of 25-49%  LCX is a non-dominant artery that gives rise to one large OM1 branch. There is calcified plaque in its proximal portion with mild stenosis of 25-49%.  Other findings:  Normal pulmonary vein drainage into the left atrium.  Normal left atrial appendage without a thrombus.  Normal size of the pulmonary artery.  IMPRESSION: 1. Coronary calcium score of 716. This was 60 percentile for age and sex matched control.  2. Normal coronary origin with right dominance.  3. CAD-RADS 3. Moderate stenosis 50 -69% - proximal/mid LAD. Consider symptom-guided anti-ischemic pharmacotherapy as well as risk factor modification per guideline directed care. Additional analysis with CT FFR will be submitted.  4. Plaque analysis: TPV - 766 mm3 - 87th percentile, Calcified plaque 248 mm3, non calcified plaque 518 mm3. TPV is severe.   Electronically Signed By: Gypsy Balsam M.D. On: 07/19/2023 11:51      ______________________________________________________________________________________________       EKG: Most recent EKG was obtained on 06/29/2022, this revealed sinus bradycardia, interventricular conduction delay, consistent with prior EKG tracings, heart rate 58 bpm.  Recent Labs: 12/23/2022: ALT 19 06/21/2023: BUN 15; Creatinine, Ser 0.77; Potassium 4.3; Sodium 141  Recent Lipid Panel    Component Value  Date/Time   CHOL 175 09/07/2021 1142   TRIG 204 (H) 09/07/2021 1142   HDL 48 09/07/2021 1142   CHOLHDL 3.6 09/07/2021 1142   LDLCALC 92 09/07/2021 1142   LDLDIRECT 88 12/23/2022 1122     Risk Assessment/Calculations:                Physical Exam:    VS:  BP 136/80   Pulse 66   Ht 5\' 1"  (1.549 m)   Wt 218 lb (98.9 kg)   SpO2 99%   BMI 41.19 kg/m     Wt Readings from Last 3 Encounters:  07/25/23 218 lb (98.9 kg)  06/21/23 218 lb (98.9 kg)  12/23/22 215 lb (97.5 kg)     GEN:  Well nourished, well developed in no acute distress HEENT: Normal NECK: No JVD; No carotid bruits LYMPHATICS: No lymphadenopathy CARDIAC: RRR, 2/6 systolic murmur, rubs, gallops RESPIRATORY:  Clear to auscultation without rales, wheezing or rhonchi  ABDOMEN: Soft, non-tender, non-distended MUSCULOSKELETAL:  No edema; No deformity  SKIN: Warm and dry NEUROLOGIC:  Alert and oriented x 3 PSYCHIATRIC:  Normal affect   ASSESSMENT:    1. Coronary artery disease involving native coronary artery of native heart without angina pectoris   2. Essential hypertension   3. Mixed hyperlipidemia   4. SVT (supraventricular tachycardia) (HCC)   5. Hyperlipidemia LDL goal <70      PLAN:    In order of problems listed above:  Coronary artery disease-nonobstructive per [facility] in 2020 >> 06/2023 ccta  calcium score of 766, 87th percentile, total plaque volume with severe however FFR demonstrated low likelihood of hemodynamic significance. She has some anxiety around recent testing understandably,  we discussed secondary prevention at length. She does have episodes of chest pain but they are not consistent with angina. We will try a low dose of Imdur to see if it helps with her symptoms. Stable with no anginal symptoms. No indication for ischemic evaluation.  Continue aspirin 81 mg daily, continue Coreg 25 mg twice daily, continue nitroglycerin as needed--has not needed, continue Crestor 20 mg daily.    Palpitations/SVT-currently quiescent, monitor revealed predominant sinus rhythm, episodes of SVT and VE's, although infrequent.  Continue Coreg 25 mg twice daily.  Hypertension-blood pressure is 136/80, continue Norvasc 10 mg daily, continue carvedilol 25 mg twice daily, continue lisinopril-HCTZ 20-12.5 mg daily.  Hyperlipidemia-most recent LDL elevated at 88, cannot tolerate higher doses of Crestor, will start Zetia 10 mg daily.   DM2-managed by PCP.  Obesity-BMI is 65, she has been working on changing her dietary intake, also exercising to her ability however she also has significant arthralgias.  Disposition-Start Zetia 10 mg daily, start Imdur 30 mg daily, follow up in 1 month. Will repeat FLP and LFTs at that time.        Medication Adjustments/Labs and Tests Ordered: Current medicines are reviewed at length with the patient today.  Concerns regarding medicines are outlined above.  No orders of the defined types were placed in this encounter.  Meds ordered this encounter  Medications   ezetimibe (ZETIA) 10 MG tablet    Sig: Take 1 tablet (10 mg total) by mouth daily.    Dispense:  90 tablet    Refill:  3   isosorbide mononitrate (IMDUR) 30 MG 24 hr tablet    Sig: Take 1 tablet (30 mg total) by mouth daily.    Dispense:  90 tablet    Refill:  3    Patient Instructions  Medication Instructions:    Start Zetia 10 mg once a day in the evening.  Start Imdur 30 mg once a day in the morning.    *If you need a refill on your cardiac medications before your next appointment,  please call your pharmacy*   Lab Work: None Ordered If you have labs (blood work) drawn today and your tests are completely normal, you will receive your results only by: MyChart Message (if you have MyChart) OR A paper copy in the mail If you have any lab test that is abnormal or we need to change your treatment, we will call you to review the results.   Testing/Procedures: None Ordered   Follow-Up: At [facility], you and your health needs are our priority.  As part of our continuing mission to provide you with exceptional heart care, we have created designated Provider Care Teams.  These Care Teams include your primary Cardiologist (physician) and Advanced Practice Providers (APPs -  Physician Assistants and Nurse Practitioners) who all work together to provide you with the care you need, when you need it.  We recommend signing up for the patient portal called "MyChart".  Sign up information is provided on this After Visit Summary.  MyChart is used to connect with patients for Virtual Visits (Telemedicine).  Patients are able to view lab/test results, encounter notes, upcoming appointments, etc.  Non-urgent messages can be sent to your provider as well.   To learn more about what you can do with MyChart, go to [email or web address].    Your next appointment:   1 month follow up with Wallis Bamberg, NP     Signed, Flossie Dibble, NP  07/25/2023 1:09 PM    [facility]

## 2023-07-25 ENCOUNTER — Encounter: Payer: Self-pay | Admitting: Cardiology

## 2023-07-25 ENCOUNTER — Ambulatory Visit: Payer: Medicare PPO | Attending: Cardiology | Admitting: Cardiology

## 2023-07-25 VITALS — BP 136/80 | HR 66 | Ht 61.0 in | Wt 218.0 lb

## 2023-07-25 DIAGNOSIS — E782 Mixed hyperlipidemia: Secondary | ICD-10-CM | POA: Diagnosis not present

## 2023-07-25 DIAGNOSIS — Z6841 Body Mass Index (BMI) 40.0 and over, adult: Secondary | ICD-10-CM

## 2023-07-25 DIAGNOSIS — I2510 Atherosclerotic heart disease of native coronary artery without angina pectoris: Secondary | ICD-10-CM

## 2023-07-25 DIAGNOSIS — I10 Essential (primary) hypertension: Secondary | ICD-10-CM

## 2023-07-25 DIAGNOSIS — I471 Supraventricular tachycardia, unspecified: Secondary | ICD-10-CM

## 2023-07-25 DIAGNOSIS — E66813 Obesity, class 3: Secondary | ICD-10-CM

## 2023-07-25 DIAGNOSIS — E785 Hyperlipidemia, unspecified: Secondary | ICD-10-CM

## 2023-07-25 MED ORDER — ISOSORBIDE MONONITRATE ER 30 MG PO TB24: 30.0000 mg | ORAL_TABLET | Freq: Every day | ORAL | 3 refills | Status: AC

## 2023-07-25 MED ORDER — EZETIMIBE 10 MG PO TABS: 10.0000 mg | ORAL_TABLET | Freq: Every day | ORAL | 3 refills | Status: AC

## 2023-07-25 NOTE — Patient Instructions (Signed)
Medication Instructions:    Start Zetia 10 mg once a day in the evening.  Start Imdur 30 mg once a day in the morning.    *If you need a refill on your cardiac medications before your next appointment, please call your pharmacy*   Lab Work: None Ordered If you have labs (blood work) drawn today and your tests are completely normal, you will receive your results only by: MyChart Message (if you have MyChart) OR A paper copy in the mail If you have any lab test that is abnormal or we need to change your treatment, we will call you to review the results.   Testing/Procedures: None Ordered   Follow-Up: At [facility], you and your health needs are our priority.  As part of our continuing mission to provide you with exceptional heart care, we have created designated Provider Care Teams.  These Care Teams include your primary Cardiologist (physician) and Advanced Practice Providers (APPs -  Physician Assistants and Nurse Practitioners) who all work together to provide you with the care you need, when you need it.  We recommend signing up for the patient portal called "MyChart".  Sign up information is provided on this After Visit Summary.  MyChart is used to connect with patients for Virtual Visits (Telemedicine).  Patients are able to view lab/test results, encounter notes, upcoming appointments, etc.  Non-urgent messages can be sent to your provider as well.   To learn more about what you can do with MyChart, go to [email or web address].    Your next appointment:   1 month follow up with Wallis Bamberg, NP

## 2023-08-03 DIAGNOSIS — Z8679 Personal history of other diseases of the circulatory system: Secondary | ICD-10-CM | POA: Insufficient documentation

## 2023-08-21 ENCOUNTER — Encounter: Payer: Self-pay | Admitting: *Deleted

## 2023-08-21 NOTE — Progress Notes (Unsigned)
 Cardiology Office Note:    Date:  08/22/2023   ID:  Marie Hughes, DOB 10/26/1955, MRN [id number]  PCP:  Malva Limes, FNP   [facility] Providers Cardiologist:  Garwin Brothers, MD     Referring MD: Malva Limes, FNP   CC: follow up CAD  History of Present Illness:    Marie Hughes is a 68 y.o. female with a hx of nonobstructive CAD, hypertension, hyperlipidemia, prediabetes, obesity, LBBB, depression.  Echocardiogram on 05/28/2018 revealed an EF of 55 to 60%, mild MR, mild to moderate left atrial enlargement. Left heart cath on 08/20/2018 revealed nonobstructive CAD. She wore a monitor in June 2021, predominant underlying rhythm was sinus, unremarkable. Echo 12/06/2022 EF 55 to 60%, mild concentric LVH, grade 1 DD, mild MR, mild dilatation of ascending aorta 38 mm 7-day ZIO 12/01/2022 average heart rate 65 bpm predominant I rhythm was sinus BBB/IVCD was present, 2 runs of SVT, isolated Ve's 07/18/2023 ccta calcium score of 766, 87th percentile, total plaque volume with severe however FFR demonstrated low likelihood of hemodynamic significance  Evaluated by Dr. Tomie China on 06/21/2023, she endorsed episodes of occasional chest tightness that radiated to her neck.  A coronary CTA was arranged which was completed on 07/18/2023 which revealed a calcium score of 766, 87th percentile, total plaque volume with severe however FFR demonstrated low likelihood of hemodynamically significant stenosis.  Evaluated by my cell phone 07/25/2023 to discuss her abnormal coronary CTA.  She has vague symptoms including fatigue that she worries could be associated with her coronary artery disease, we started her on low-dose Imdur with plans for quick follow-up.  She presents today for follow-up of her CAD.  She is feeling better with regards to episodes of atypical chest pain after we started her Imdur.  She had been following with a healthy weight and wellness program in [place], they have  been trying to get her approved for [facility] but were unable to do so.  She does try to stay physically active, works out at the gym several days per week and does not have any anginal complaints. She denies chest pain, palpitations, dyspnea, pnd, orthopnea, n, v, dizziness, syncope, edema, weight gain, or early satiety.     Past Medical History:  Diagnosis Date   Acute cystitis without hematuria 07/30/2019   Last Assessment & Plan:  Formatting of this note might be different from the original. UA most consistent with uncomplicated UTI. No red flag features to suggest need for imaging, special testing or urgent specialty consultation at this time. Antibiotic started per visit orders. Will send UA for culture to confirm sensitivity to abx given. Recommend f/u with PCP or return to clinic or ED for worse   Arthralgia of knee, left 05/17/2019   Last Assessment & Plan:  Formatting of this note might be different from the original. Recently provided intra-articular injection is done very well, could repeat in the future if persistent symptoms.   Arthropathy, unspecified 11/01/2022   Last Assessment & Plan: Formatting of this note might be different from the original. Problem complexity: Chronic problem/illness, stable Assessment:  she endorses chronic pain of joints in multiple sites. Plan: labs and will advise accordingly.     Bilateral carotid bruits 10/17/2019   Bilateral leg cramps 12/07/2021   Last Assessment & Plan: Formatting of this note might be different from the original. New to provider. Problem complexity: Chronic problem/illness, with progression Assessment:  Nocturnal BLE cramps for several months. This is likely  multifactoral given her spinal disease and possible hypokalemia. Plan: labs today and will advise accordingly. Supportive care discussed.     Bladder problem    Cervical post-laminectomy syndrome 08/21/2017   Last Assessment & Plan:  Formatting of this note might be different from  the original. Multilevel cervical fusion with continued neck and right upper extremity radicular pain.  Stable at this time without any progression, we will hold on any further imaging at this time.   Chest pain 12/21/2017   Last Assessment & Plan:  Intermittent. Non-radiating. No N/V/D. No vision changes.  Last Assessment & Plan:  Formatting of this note might be different from the original. No complaints of chest pain since hospital discharge.   Chronic foot pain, right 01/24/2020   Last Assessment & Plan:  Formatting of this note might be different from the original. 68 year old female with chronic pain of multiple etiologies, at this time most limiting complaint is right foot pain secondary to refractory plantar fasciitis status post surgical intervention.  She is followed by Podiatry.  Today I recommended obtaining a more appropriate supportive footwear, we have also discu   Chronic left shoulder pain 06/19/2020   Last Assessment & Plan:  Formatting of this note might be different from the original. Recently referred to orthopedics by PCP, several weeks status post intra-articular injection with excellent benefit.  I did let patient know that I am happy to repeat that procedure in the future should she need it.   Chronic left-sided thoracic back pain 07/14/2022   Chronic pain of right ankle 06/19/2020   Chronic right shoulder pain 11/12/2018   Last Assessment & Plan:  Formatting of this note might be different from the original. Today we will take plain films of the shoulder to further evaluate etiology of her pain.  Discussed with patient that it is very likely radicular in nature, as I do not have any findings on exam suspicious of true shoulder etiology.   Chronically on opiate therapy 12/20/2016   Last Assessment & Plan:  Risks of this type of medication including the risk of addiction and, if misused, of death are reviewed with the patient.  The patient feels the benefits of pain relief and  increased ability to engage in self-care activities improve the quality of life to outweigh these risks.  Last Assessment & Plan:  Formatting of this note might be different from the original. Will cont   Class 2 drug-induced obesity with serious comorbidity and body mass index (BMI) of 39.0 to 39.9 in adult 12/21/2017   Last Assessment & Plan:  Formatting of this note might be different from the original. BMI Assessment: Current Body mass index is 39.91 kg/m.  Patient BMI currently is above average (>25 kg/m2); BMI follow up plan is completed . Current barriers to healthy weight management include none.  BMI Plan:  Today, Aalina and I have discussed methods to help address her current weight.   General weight l   Class 2 severe obesity due to excess calories with serious comorbidity and body mass index (BMI) of 39.0 to 39.9 in adult [facility]) 06/19/2020   Last Assessment & Plan:  Formatting of this note might be different from the original. BMI Assessment: Current Body mass index is 39.68 kg/m.  Patient BMI currently is above average (>25 kg/m2); BMI follow up plan is completed . Current barriers to healthy weight management include pain.  BMI Plan:  Today, Vicy and I have discussed methods to help address her current  weight.   General weight l   Complete tear of rotator cuff 07/14/2022   Coronary artery disease involving native coronary artery of native heart with unstable angina pectoris (HCC) 09/12/2018   Last Assessment & Plan:  Formatting of this note might be different from the original. She is followed by cardiology and has an upcoming appointment with them.   Advised to continue current medications.   DDD (degenerative disc disease), lumbar 08/21/2017   Last Assessment & Plan:  Stble. No exacerbation of symptoms.  Last Assessment & Plan:  Formatting of this note might be different from the original. Chronic back pain and bilateral lower extremity radicular symptoms that appear to correspond to  L4/5 distribution. Review of most recent lumbar MRI from 2013 shows multilevel degenerative changes most prominent at L4-5 where there is mild spinal steno   Degenerative cervical spinal stenosis 01/05/2016   Last Assessment & Plan:  Formatting of this note might be different from the original. We will continue current medications.  Patient understands she may benefit percutaneous interventions.  Patient has had previous cervical spine surgeries.   Depression    Diabetes mellitus due to underlying condition with unspecified complications (HCC) 04/12/2021   Dietary counseling 04/17/2020   Diverticulosis 07/14/2022   Dizziness 12/18/2020   Last Assessment & Plan:  Formatting of this note might be different from the original. Patient complains of dizziness and overall not feeling well.  She does complain of feelings of hot and cold as well as sweats.  She admits working at the [employer] is reunion in the direct heat.  She did this for several days.  She feels she may be dehydrated somewhat.  She has no focal neuro def   Dyspnea on exertion 04/12/2020   Dysuria 07/24/2019   Last Assessment & Plan:  Formatting of this note might be different from the original. Assessment  Pain intermittent in the suprapubic. Mild backpain. Symptoms "come and go", then come back. Hx of kidney stone.  Reports weak urine stream Afebrile, no chills This may be a recurrence of kidney stone UA +wbc 75, will culture  Plan  Will start Flomax and await the urine culture   Elevated blood pressure reading 07/29/2021   Last Assessment & Plan:   Formatting of this note might be different from the original.  Blood pressure elevated in clinic today.  Patient will continue to follow-up with his primary care provider for further management.     Encounter for screening mammogram for breast cancer 12/18/2019   Essential hypertension 01/05/2016   Last Assessment & Plan:  Formatting of this note might be different from the  original. Blood pressure elevated today-follow-up with primary care for further management.   Exposure to 2019 novel coronavirus 07/01/2019   Last Assessment & Plan:  Formatting of this note might be different from the original. POCT Sars-Cov-2 Sophia NEGATIVE POCT Sars-COV-2 Aptima-COV  I have explained to the patient they must quarantine according to [facility] current guideline. They are encouraged to protect others in the household by wearing a mask, covering cough, and cleansing any area used by the patient, and if possible quarantine in    Heart disease 12/21/2017   Last Assessment & Plan:  No reports of chest pain in approximately 8 months. Will refill Nitro SL tabs.  Last Assessment & Plan:  Formatting of this note might be different from the original. Complaining of more frequent episodes of dyspnea on exertion, and bilateral leg swelling which are chronic  concerns for her.  Advise she follow-up with her PCP in regards to these concerns for further evaluat   Hematuria of unknown etiology 07/14/2022   Last Assessment & Plan: Formatting of this note might be different from the original. Patient complaining of left flank pain that is chronic.  However, we obtained a urine to rule out an underlying cystitis or pyelonephritis.  Her urine did show some hematuria.  We will send for microscopic analysis as well as culture.     History of COVID-19 04/12/2022   Last Assessment & Plan: Formatting of this note might be different from the original. Acute illness that is progressing. Diagnosis COVID 19 per rapid testing. Plan Discussed the importance of avoiding unnecessary antibiotic therapy. Suggested symptomatic OTC remedies. OTC antihistamine for rash. Discussed paxlovid. She defers at this time and requests to manage without pharmacotherapy. She has no    Hypertension    Hypertensive urgency 01/05/2016   Last Assessment & Plan:  Formatting of this note might be different from the original. Blood pressure is  controlled today. 128/70. Advise to continue monitoring. Notify office for elevation. Continue current medications and f/u with cardiology.   IFG (impaired fasting glucose) 12/21/2017   Last Assessment & Plan:  Monitoring with blood work. POC according to results.'  Last Assessment & Plan:  Formatting of this note is different from the original. Lab Results  Component Value Date   HGBA1C 6.2 12/21/2017   Prediabetic. Monitoring with BW  Monitoring with blood work  Change in diet including a whole plant based diet, limiting meats especially red meat encouraged.   Avoiding unhea   Impingement syndrome of left shoulder 07/14/2022   Lack of energy 11/01/2022   Last Assessment & Plan: Formatting of this note might be different from the original. Undiagnosed problem with uncertain prognosis. Assessment: complains of chronic fatigue. Plan: labs and will advise accordingly     Left bundle branch block (LBBB) 10/17/2019   Formatting of this note might be different from the original. Noted to have LBBB in old records. LBBB mentioned in cardiologist's office note 10/17/19.  Last Assessment & Plan:  Formatting of this note might be different from the original. This is chronic and was noted on recent stress test. She is following with cardiology. Advised to proceed with next appointment.  She is asymptomatic today.   Left upper quadrant pain 08/03/2021   Last Assessment & Plan:   Formatting of this note might be different from the original.  Problem complexity: Chronic problem/illness, with progression     Assessment:  Left upper quadrant pain for several months. She is fearful may have pancreatic cancer due to significant family hx of similar presentation. She is scheduled for MRI of the thoracic spine this week to evaluate for issues of the spin   Localized swelling of both lower legs 04/13/2021   Last Assessment & Plan:   Formatting of this note might be different from the original.  Patient complains of an  approximate 2 month onset of bilateral lower extremity swelling.  However, upon exam I did not note any changes from previous exam.  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  There was no pitting.  Weight is stable without significant weight gain.  Her lungs were clear to ausc   Long term (current) use of opiate analgesic 12/20/2016   Last Assessment & Plan: Formatting of this note might be different from the original. Patient and I have discussed the hazardous effects of continued  opiate pain medication usage. Risks and benefits of above medications including but not limited to possibility of hyperalgesia, respiratory depression, sedation, and even death were discussed with the patient who expressed an understanding. Patient i   Long term use of drug 12/18/2019   Lumbar radiculitis 01/05/2016   Last Assessment & Plan:  Formatting of this note might be different from the original. Patient will continue with current medications.  Patient understands she may benefit percutaneous interventions.   Mass of right foot 07/14/2022   Mixed hyperlipidemia 01/05/2016   Last Assessment & Plan:  Formatting of this note is different from the original. Problem Complexity:  Chronic problem/illness, stable  Pertinent Data: LDL Calculated (mg/dL)  Date Value  16/03/9603 48   Cholesterol (mg/dl)  Date Value  54/02/8118 135   HDL (mg/dl)  Date Value  14/78/2956 34.4 (L)   Triglycerides (mg/dl)  Date Value  21/30/8657 261 (H)   AST (U/L)  Date Value  12/18/2019 20   ALT (   Morbid obesity (HCC) 04/16/2018   Muscle pain    Muscle spasm of back 08/21/2017   Last Assessment & Plan:  Formatting of this note might be different from the original. Continue p.r.n. Soma   MVP (mitral valve prolapse) 07/14/2022   Nausea 12/18/2020   Nephrolithiasis 07/14/2022   Nocturia 07/14/2022   Obesity due to excess calories 01/05/2016   Last Assessment & Plan:  Formatting of this note might be different from the original. Diet  was discussed and reviewed with the patient. Modify diet to eliminate bad fats (fast food, junk foods) and increase fiber intake. Regular exercise several times per week as appropriate, along with proper diet will help to improve overall health. Discussion concerning the health risk of obesity educated. Edu   Otalgia, left ear 12/18/2019   Last Assessment & Plan:  Formatting of this note might be different from the original. Physical examination revealed a normal exam.  No evidence of infection.  No evidence of cerumen impaction.   Other chronic pain 08/21/2017   Last Assessment & Plan:  ALEEYAH BENSEN is a 67 y.o.  year old female with Low back pain, Neck pain, due to Multilevel multifactorial degenerative changes in the lumbar and cervical spine, resulting in lower extremity radiculopathy bilaterally, in the setting of previous surgery in the cervical spine. Today I will refill the patient's current medications of Hydrocodone 10/325 mg PO PRN qid, and S   Pain and swelling of lower extremity, right 11/12/2018   Last Assessment & Plan:  Formatting of this note might be different from the original. Due to patient's recent onset of worsening lower extremity pain, with increased unilateral swelling and diffuse calf tenderness of the right extremity, we recommend she go for a right lower extremity ultrasound to rule out a DVT.   Pain medication agreement 08/10/2018   Last Assessment & Plan:  There is a current opioid agreement on file within the clinic and urine drug screens will be collected as needed to be in compliance with clinic policies.  Last Assessment & Plan:  Formatting of this note might be different from the original. Review of prior UDS results are consistent with regimen.  [place] controlled substance registry reviewed and is consistent wi   Plantar fasciitis, right 07/14/2022   Postmenopausal state 10/11/2021   Pre-syncope 04/12/2020   Last Assessment & Plan:  Formatting of this note  might be different from the original. No further episodes since hospital discharge.   Recurrent  cystitis 12/18/2019   Recurrent major depressive disorder, in partial remission (HCC) 01/05/2016   Last Assessment & Plan:  Formatting of this note might be different from the original. This is a chronic problem and is stable.  She denies suicidal or homicidal ideations.  Advised to continue current medications.   Recurrent UTI 07/14/2022   Suspected 2019 novel coronavirus infection 12/18/2020   Swelling    Tendinitis of left rotator cuff 07/14/2022   Thoracic radiculopathy 02/11/2019   Last Assessment & Plan:  Formatting of this note might be different from the original. Plain films of the thoracic spine completed after last appointment show ACDF with mid lower cervical spine with thoracic vertebral body alignment and disc spaces normal.  Responded well to previous oral steroids.  Have discussed possibility of C7-T1 CESI should her symptoms return.   Type 2 diabetes mellitus without complication, without long-term current use of insulin (HCC) 12/17/2020   Last Assessment & Plan:  Formatting of this note might be different from the original. Encourage her today to focus on losing weight, decreasing carbohydrate intake.  Hopefully she can make some improvements here to get this better controlled.   Uncomplicated opioid dependence (HCC) 01/05/2016   Viral syndrome 12/18/2020   Vitamin D deficiency 12/18/2019    Past Surgical History:  Procedure Laterality Date   ABDOMINAL HYSTERECTOMY     FOOT SURGERY     HAND SURGERY     BOTH HANDS   LEFT HEART CATH AND CORONARY ANGIOGRAPHY N/A 08/20/2018   Procedure: LEFT HEART CATH AND CORONARY ANGIOGRAPHY;  Surgeon: Corky Crafts, MD;  Location: [facility] INVASIVE CV LAB;  Service: Cardiovascular;  Laterality: N/A;   NECK SURGERIES      Current Medications: Current Meds  Medication Sig   amitriptyline (ELAVIL) 10 MG tablet Take 10 mg by mouth at bedtime.    amLODipine (NORVASC) 10 MG tablet Take 10 mg by mouth every evening.    aspirin EC 81 MG tablet Take 81 mg by mouth daily.   carisoprodol (SOMA) 350 MG tablet Take 350 mg by mouth daily as needed for muscle spasms.    carvedilol (COREG) 25 MG tablet Take 1 tablet (25 mg total) by mouth 2 (two) times daily with a meal.   Coenzyme Q10 100 MG capsule Take 100 mg by mouth daily.   Dietary Management Product (VASCULERA) TABS Take 1 tablet by mouth daily.   ezetimibe (ZETIA) 10 MG tablet Take 1 tablet (10 mg total) by mouth daily.   HYDROcodone-acetaminophen (NORCO) 10-325 MG per tablet Take 1 tablet by mouth every 6 (six) hours as needed for moderate pain.   isosorbide mononitrate (IMDUR) 30 MG 24 hr tablet Take 1 tablet (30 mg total) by mouth daily.   KLOR-CON M20 20 MEQ tablet Take 20 mEq by mouth daily at 3 pm.    lisinopril-hydrochlorothiazide (PRINZIDE,ZESTORETIC) 20-12.5 MG per tablet Take 1 tablet by mouth daily.   Multiple Vitamins-Minerals (ADULT GUMMY PO) Take 1 tablet by mouth 2 (two) times a week.   nitroGLYCERIN (NITROSTAT) 0.4 MG SL tablet Place 1 tablet (0.4 mg total) under the tongue every 5 (five) minutes as needed for chest pain.   OVER THE COUNTER MEDICATION Take 1 tablet by mouth daily. B12, Calcium and D3   rosuvastatin (CRESTOR) 10 MG tablet Take 10 mg (1 tablet) in the morning and 20 mg (2 tablets) in the evening.     Allergies:   Nitrofurantoin   Social History   Socioeconomic History  Marital status: Widowed    Spouse name: Not on file   Number of children: Not on file   Years of education: Not on file   Highest education level: Not on file  Occupational History   Not on file  Tobacco Use   Smoking status: Former    Current packs/day: 0.00    Types: Cigarettes    Quit date: 2008    Years since quitting: 17.2   Smokeless tobacco: Never  Substance and Sexual Activity   Alcohol use: No    Alcohol/week: 0.0 standard drinks of alcohol   Drug use: No   Sexual  activity: Not on file  Other Topics Concern   Not on file  Social History Narrative   Not on file   Social Drivers of Health   Financial Resource Strain: Not on file  Food Insecurity: Not on file  Transportation Needs: Not on file  Physical Activity: Not on file  Stress: Not on file  Social Connections: Not on file     Family History: The patient's family history includes Coronary artery disease in her father; Heart attack in her mother and paternal grandmother; Other in her brother and father; Pneumonia in her mother.  ROS:   Please see the history of present illness.     All other systems reviewed and are negative.  EKGs/Labs/Other Studies Reviewed:    The following studies were reviewed today: Cardiac Studies & Procedures   ______________________________________________________________________________________________ CARDIAC CATHETERIZATION  CARDIAC CATHETERIZATION 08/20/2018  Narrative  Prox RCA lesion is 25% stenosed.  Mid Cx lesion is 40% stenosed.  Prox LAD lesion is 25% stenosed.  Ost 1st Diag lesion is 50% stenosed.  LV end diastolic pressure is moderately elevated.  The left ventricular ejection fraction is 50-55% by visual estimate.  The left ventricular systolic function is normal.  There is no aortic valve stenosis.  Nonobstructive CAD.  Moderately increased LVEDP.  Consider diuretic to help with shortness of breath.  Continue with aggressive risk factor modification including weight loss.  Findings Coronary Findings Diagnostic  Dominance: Right  Left Anterior Descending Prox LAD lesion is 25% stenosed.  First Diagonal Branch Ost 1st Diag lesion is 50% stenosed.  Left Circumflex Vessel is small. Mid Cx lesion is 40% stenosed.  Right Coronary Artery Prox RCA lesion is 25% stenosed.  Intervention  No interventions have been documented.   STRESS TESTS  MYOCARDIAL PERFUSION IMAGING 05/16/2018  Narrative  The left ventricular  ejection fraction is normal (55-65%).  Nuclear stress EF: 58%.  There was no ST segment deviation noted during stress.  The study is normal.  This is a low risk study.  Thinning of the anteroseptum at mid and apical level likely from LBBB No ischemia EF 58% with no RWMAls low risk study   ECHOCARDIOGRAM  ECHOCARDIOGRAM COMPLETE 12/06/2022  Narrative ECHOCARDIOGRAM REPORT    Patient Name:   ROME Kazmierczak Date of Exam: 12/06/2022 Medical Rec #:  [id number]     Height:       61.5 in Accession #:    [id number]    Weight:       216.0 lb Date of Birth:  [birth year]    BSA:          1.963 m Patient Age:    66 years      BP:           120/80 mmHg Patient Gender: F             HR:  61 bpm. Exam Location:  [place]  Procedure: 2D Echo, Cardiac Doppler, Color Doppler and Strain Analysis  Indications:    Shortness of breath [R06.02 (ICD-10-CM)]  History:        Patient has prior history of Echocardiogram examinations, most recent 05/28/2018. CAD, Arrythmias:LBBB; Risk Factors:Hypertension, Dyslipidemia and Diabetes.  Sonographer:    Margreta Journey RDCS Referring Phys: [phone] Dequita Schleicher C Dannie Hattabaugh  IMPRESSIONS   1. Left ventricular ejection fraction, by estimation, is 55 to 60%. The left ventricle has normal function. The left ventricle has no regional wall motion abnormalities. There is mild concentric left ventricular hypertrophy. Left ventricular diastolic parameters are consistent with Grade I diastolic dysfunction (impaired relaxation). The average left ventricular global longitudinal strain is -17.0 %. 2. Right ventricular systolic function is normal. The right ventricular size is normal. There is normal pulmonary artery systolic pressure. 3. The mitral valve is normal in structure. Mild mitral valve regurgitation. No evidence of mitral stenosis. 4. The aortic valve is normal in structure. Aortic valve regurgitation is not visualized. No aortic stenosis is present. 5.  Aortic Normal DTA. There is mild dilatation of the ascending aorta, measuring 38 mm. 6. The inferior vena cava is normal in size with greater than 50% respiratory variability, suggesting right atrial pressure of 3 mmHg.  FINDINGS Left Ventricle: Left ventricular ejection fraction, by estimation, is 55 to 60%. The left ventricle has normal function. The left ventricle has no regional wall motion abnormalities. The average left ventricular global longitudinal strain is -17.0 %. The left ventricular internal cavity size was normal in size. There is mild concentric left ventricular hypertrophy. Left ventricular diastolic parameters are consistent with Grade I diastolic dysfunction (impaired relaxation). Normal left ventricular filling pressure.  Right Ventricle: The right ventricular size is normal. No increase in right ventricular wall thickness. Right ventricular systolic function is normal. There is normal pulmonary artery systolic pressure. The tricuspid regurgitant velocity is 2.41 m/s, and with an assumed right atrial pressure of 3 mmHg, the estimated right ventricular systolic pressure is 26.2 mmHg.  Left Atrium: Left atrial size was normal in size.  Right Atrium: Right atrial size was normal in size.  Pericardium: There is no evidence of pericardial effusion.  Mitral Valve: The mitral valve is normal in structure. Mild mitral valve regurgitation. No evidence of mitral valve stenosis.  Tricuspid Valve: The tricuspid valve is normal in structure. Tricuspid valve regurgitation is mild . No evidence of tricuspid stenosis.  Aortic Valve: The aortic valve is normal in structure. Aortic valve regurgitation is not visualized. No aortic stenosis is present. Aortic valve mean gradient measures 6.3 mmHg. Aortic valve peak gradient measures 12.0 mmHg. Aortic valve area, by VTI measures 2.38 cm.  Pulmonic Valve: The pulmonic valve was normal in structure. Pulmonic valve regurgitation is not  visualized. No evidence of pulmonic stenosis.  Aorta: The aortic root is normal in size and structure, the aortic arch was not well visualized and Normal DTA. There is mild dilatation of the ascending aorta, measuring 38 mm.  Venous: A normal flow pattern is recorded from the right upper pulmonary vein. The inferior vena cava is normal in size with greater than 50% respiratory variability, suggesting right atrial pressure of 3 mmHg.  IAS/Shunts: No atrial level shunt detected by color flow Doppler.   LEFT VENTRICLE PLAX 2D LVIDd:         5.00 cm   Diastology LVIDs:         3.50 cm   LV e' medial:  7.83 cm/s LV PW:         1.10 cm   LV E/e' medial:  12.4 LV IVS:        1.10 cm   LV e' lateral:   11.00 cm/s LVOT diam:     2.00 cm   LV E/e' lateral: 8.8 LV SV:         93 LV SV Index:   47        2D Longitudinal Strain LVOT Area:     3.14 cm  2D Strain GLS Avg:     -17.0 %   RIGHT VENTRICLE             IVC RV Basal diam:  3.40 cm     IVC diam: 1.80 cm RV Mid diam:    2.70 cm RV S prime:     12.50 cm/s TAPSE (M-mode): 2.7 cm  LEFT ATRIUM             Index        RIGHT ATRIUM           Index LA diam:        4.90 cm 2.50 cm/m   RA Area:     17.20 cm LA Vol (A2C):   85.7 ml 43.65 ml/m  RA Volume:   42.10 ml  21.44 ml/m LA Vol (A4C):   61.2 ml 31.17 ml/m LA Biplane Vol: 73.3 ml 37.33 ml/m AORTIC VALVE AV Area (Vmax):    2.22 cm AV Area (Vmean):   2.22 cm AV Area (VTI):     2.38 cm AV Vmax:           173.00 cm/s AV Vmean:          114.000 cm/s AV VTI:            0.390 m AV Peak Grad:      12.0 mmHg AV Mean Grad:      6.3 mmHg LVOT Vmax:         122.00 cm/s LVOT Vmean:        80.500 cm/s LVOT VTI:          0.295 m LVOT/AV VTI ratio: 0.76  AORTA Ao Root diam: 3.10 cm Ao Asc diam:  3.80 cm Ao Desc diam: 2.10 cm  MITRAL VALVE               TRICUSPID VALVE MV Area (PHT): 3.53 cm    TR Peak grad:   23.2 mmHg MV Decel Time: 215 msec    TR Vmax:        241.00 cm/s MR  Peak grad: 37.0 mmHg MR Vmax:      304.00 cm/s  SHUNTS MV E velocity: 97.20 cm/s  Systemic VTI:  0.30 m MV A velocity: 90.10 cm/s  Systemic Diam: 2.00 cm MV E/A ratio:  1.08  Norman Herrlich MD Electronically signed by Norman Herrlich MD Signature Date/Time: 12/06/2022/4:56:14 PM    Final    MONITORS  LONG TERM MONITOR (3-14 DAYS) 11/25/2022  Narrative Patch Wear Time:  8 days and 6 hours (2024-05-31T11:48:53-0400 to 2024-06-08T18:23:30-0400)  Patient had a min HR of 47 bpm, max HR of 158 bpm, and avg HR of 65 bpm.  Predominant underlying rhythm was Sinus Rhythm. Bundle Branch Block/IVCD was present.  2 Supraventricular Tachycardia runs occurred, the run with the fastest interval lasting 10 beats with a max rate of 158 bpm, the longest lasting 14 beats with an avg rate of 118 bpm. Isolated SVEs  were rare (<1.0%), SVE Couplets were rare (<1.0%), and SVE Triplets were rare (<1.0%).  Isolated VEs were rare (<1.0%, 1284), VE Couplets were rare (<1.0%, 80), and VE Triplets were rare (<1.0%, 7).  Impression: Unremarkable event monitor with brief atrial runs noted.   CT SCANS  CT CORONARY MORPH W/CTA COR W/SCORE 07/18/2023  Addendum 08/02/2023 10:45 PM ADDENDUM REPORT: 08/02/2023 22:43  EXAM: OVER-READ INTERPRETATION  CT CHEST  The following report is an over-read performed by radiologist Dr. Narda Rutherford of [facility] on 08/02/2023. This over-read does not include interpretation of cardiac or coronary anatomy or pathology. The coronary CTA interpretation by the cardiologist is attached.  COMPARISON:  None.  FINDINGS: Vascular: Mild aortic atherosclerosis. The included aorta is normal in caliber.  Mediastinum/nodes: No adenopathy or mass.  Moderate hiatal hernia.  Lungs: No focal airspace disease. Pleural based 6 mm right lower lobe nodule series 308, image 155. Pleural based left lower lobe nodule series 308, image 170. 2 mm perifissural nodule in  the superior segment of the right lower lobe, series 308, image 31, typical of an intrapulmonary lymph node. 3 mm perifissural right upper lobe nodule series 3, image 70, also typical of intrapulmonary lymph node. No pleural fluid. The included airways are patent.  Upper abdomen: No acute or unexpected findings.  Musculoskeletal: There are no acute or suspicious osseous abnormalities. Moderate midthoracic spondylosis  IMPRESSION: 1. Moderate hiatal hernia. 2. Pulmonary nodules measuring up to 6 mm. Morphology favors benign process. Non-contrast chest CT at 3-6 months is recommended. If the nodules are stable at time of repeat CT, then future CT at 18-24 months (from today's scan) is considered optional for low-risk patients, but is recommended for high-risk patients. This recommendation follows the consensus statement: Guidelines for Management of Incidental Pulmonary Nodules Detected on CT Images: From the Fleischner Society 2017; Radiology 2017; 284:228-243. 3.  Aortic Atherosclerosis (ICD10-I70.0).   Electronically Signed By: Narda Rutherford M.D. On: 08/02/2023 22:43  Narrative CLINICAL DATA:  CP  EXAM: Cardiac/Coronary  CTA  TECHNIQUE: The patient was scanned on a GE Apex scanner.  FINDINGS: A 120 kV prospective scan was triggered in the descending thoracic aorta at 111 HU's. Axial non-contrast 3 mm slices were carried out through the heart. The data set was analyzed on a dedicated work station and scored using the Agatson method. Gantry rotation speed was 250 msecs and collimation was .6 mm. No beta blockade and 0.8 mg of sl NTG was given. The 3D data set was reconstructed at 75 % of the R-R cycle. Diastolic phases were analyzed on a dedicated work station using MPR, MIP and VRT modes. The patient received 80 cc of contrast.  Aorta:  Normal size.  No calcifications.  No dissection.  Aortic Valve:  Trileaflet.  No calcifications.  Coronary Arteries:  Normal  coronary origin.  Right dominance.  RCA is a large dominant artery that gives rise to PDA and PLA. There is calcified plaque in the proximal portion of the RCA with mild stenosis of 25-49%. In the distal portion of RCA there are few calcified plaques with minimal stenosis of 0-24%.  Left main is a large artery that gives rise to LAD and LCX arteries as well as moderate size Intermediate Branch.  LAD is a large vessel that has calcified plaque in the proximal portion extending to mid portion with moderate stenosis of 50-69%. This artery gives rise to moderate size Da and small D2.  Intermediate Branch has calcified plaque in its  proximal portion with mild stenosis of 25-49%  LCX is a non-dominant artery that gives rise to one large OM1 branch. There is calcified plaque in its proximal portion with mild stenosis of 25-49%.  Other findings:  Normal pulmonary vein drainage into the left atrium.  Normal left atrial appendage without a thrombus.  Normal size of the pulmonary artery.  IMPRESSION: 1. Coronary calcium score of 716. This was 75 percentile for age and sex matched control.  2. Normal coronary origin with right dominance.  3. CAD-RADS 3. Moderate stenosis 50 -69% - proximal/mid LAD. Consider symptom-guided anti-ischemic pharmacotherapy as well as risk factor modification per guideline directed care. Additional analysis with CT FFR will be submitted.  4. Plaque analysis: TPV - 766 mm3 - 87th percentile, Calcified plaque 248 mm3, non calcified plaque 518 mm3. TPV is severe.  Electronically Signed: By: Gypsy Balsam M.D. On: 07/19/2023 11:51     ______________________________________________________________________________________________       EKG: Most recent EKG was obtained on 06/29/2022, this revealed sinus bradycardia, interventricular conduction delay, consistent with prior EKG tracings, heart rate 58 bpm.  Recent Labs: 12/23/2022: ALT 19 06/21/2023:  BUN 15; Creatinine, Ser 0.77; Potassium 4.3; Sodium 141  Recent Lipid Panel    Component Value Date/Time   CHOL 175 09/07/2021 1142   TRIG 204 (H) 09/07/2021 1142   HDL 48 09/07/2021 1142   CHOLHDL 3.6 09/07/2021 1142   LDLCALC 92 09/07/2021 1142   LDLDIRECT 88 12/23/2022 1122     Risk Assessment/Calculations:             Physical Exam:    VS:  BP (!) 140/80   Pulse 64   Ht 5\' 1"  (1.549 m)   Wt 216 lb (98 kg)   SpO2 96%   BMI 40.81 kg/m     Wt Readings from Last 3 Encounters:  08/22/23 216 lb (98 kg)  07/25/23 218 lb (98.9 kg)  06/21/23 218 lb (98.9 kg)     GEN:  Well nourished, well developed in no acute distress HEENT: Normal NECK: No JVD; No carotid bruits LYMPHATICS: No lymphadenopathy CARDIAC: RRR, 2/6 systolic murmur, rubs, gallops RESPIRATORY:  Clear to auscultation without rales, wheezing or rhonchi  ABDOMEN: Soft, non-tender, non-distended MUSCULOSKELETAL:  No edema; No deformity  SKIN: Warm and dry NEUROLOGIC:  Alert and oriented x 3 PSYCHIATRIC:  Normal affect   ASSESSMENT:    1. Coronary artery disease involving native coronary artery of native heart without angina pectoris   2. Essential hypertension   3. Mixed hyperlipidemia   4. Class 3 severe obesity due to excess calories with serious comorbidity and body mass index (BMI) of 40.0 to 44.9 in adult (HCC)   5. SVT (supraventricular tachycardia) (HCC)       PLAN:    In order of problems listed above:  Coronary artery disease-nonobstructive per [facility] in 2020 >> 06/2023 ccta  calcium score of 766, 87th percentile, total plaque volume with severe however FFR demonstrated low likelihood of hemodynamic significance.  Continue aspirin 81 mg daily, continue Coreg 25 mg twice daily, continue Imdur 30 mg daily, continue nitroglycerin as needed--has not needed, continue Crestor-does she currently takes 2 mg in the morning and 20 mg in the evening, continue Zetia 10 mg daily.  Stable with no anginal  symptoms. No indication for ischemic evaluation.    Palpitations/SVT-currently quiescent, monitor revealed predominant sinus rhythm, episodes of SVT and VE's, although infrequent.  Continue Coreg 25 mg twice daily.  Hypertension-blood pressure slightly elevated  today 140/80, continue Norvasc 10 mg daily, continue carvedilol 25 mg twice daily, continue lisinopril-HCTZ 20-12.5 mg daily.  Hyperlipidemia-most recent LDL elevated at 88, currently on Crestor 10 mg in the morning and 20 mg in the evening, we started her on Zetia at her last office visit.  Will repeat CMET and FLP.  Obesity-we previously discussed getting her started on Wegovy but it does not appear that it is on her formulary.  Will refer her to Pharm.D. to see if there is anything they can assist her with regarding weight loss.   Disposition-repeat FLP CMET, refer to Pharm.D. for weight loss. Follow up in 6 months.         Medication Adjustments/Labs and Tests Ordered: Current medicines are reviewed at length with the patient today.  Concerns regarding medicines are outlined above.  Orders Placed This Encounter  Procedures   Comprehensive metabolic panel   Lipid panel   AMB Referral to [facility] Pharm[facility]   No orders of the defined types were placed in this encounter.   Patient Instructions  Medication Instructions:  Your physician recommends that you continue on your current medications as directed. Please refer to the Current Medication list given to you today.  *If you need a refill on your cardiac medications before your next appointment, please call your pharmacy*   Lab Work: CMP, Lipids- today If you have labs (blood work) drawn today and your tests are completely normal, you will receive your results only by: MyChart Message (if you have MyChart) OR A paper copy in the mail If you have any lab test that is abnormal or we need to change your treatment, we will call you to review the  results.   Testing/Procedures: None Ordered   Follow-Up: At [facility], you and your health needs are our priority.  As part of our continuing mission to provide you with exceptional heart care, we have created designated Provider Care Teams.  These Care Teams include your primary Cardiologist (physician) and Advanced Practice Providers (APPs -  Physician Assistants and Nurse Practitioners) who all work together to provide you with the care you need, when you need it.  We recommend signing up for the patient portal called "MyChart".  Sign up information is provided on this After Visit Summary.  MyChart is used to connect with patients for Virtual Visits (Telemedicine).  Patients are able to view lab/test results, encounter notes, upcoming appointments, etc.  Non-urgent messages can be sent to your provider as well.   To learn more about what you can do with MyChart, go to [email or web address].    Your next appointment:   6 month(s)  The format for your next appointment:   In Person  Provider:   Festus Barren   Other Instructions Referral to Pharm D- they will call for appt   Signed, Flossie Dibble, NP  08/22/2023 10:09 AM    [facility]

## 2023-08-22 ENCOUNTER — Ambulatory Visit: Payer: Medicare PPO | Attending: Cardiology | Admitting: Cardiology

## 2023-08-22 VITALS — BP 140/80 | HR 64 | Ht 61.0 in | Wt 216.0 lb

## 2023-08-22 DIAGNOSIS — I10 Essential (primary) hypertension: Secondary | ICD-10-CM

## 2023-08-22 DIAGNOSIS — I2510 Atherosclerotic heart disease of native coronary artery without angina pectoris: Secondary | ICD-10-CM | POA: Diagnosis not present

## 2023-08-22 DIAGNOSIS — E66813 Obesity, class 3: Secondary | ICD-10-CM

## 2023-08-22 DIAGNOSIS — E782 Mixed hyperlipidemia: Secondary | ICD-10-CM

## 2023-08-22 DIAGNOSIS — Z6841 Body Mass Index (BMI) 40.0 and over, adult: Secondary | ICD-10-CM

## 2023-08-22 DIAGNOSIS — I471 Supraventricular tachycardia, unspecified: Secondary | ICD-10-CM

## 2023-08-22 NOTE — Patient Instructions (Signed)
 Medication Instructions:  Your physician recommends that you continue on your current medications as directed. Please refer to the Current Medication list given to you today.  *If you need a refill on your cardiac medications before your next appointment, please call your pharmacy*   Lab Work: CMP, Lipids- today If you have labs (blood work) drawn today and your tests are completely normal, you will receive your results only by: MyChart Message (if you have MyChart) OR A paper copy in the mail If you have any lab test that is abnormal or we need to change your treatment, we will call you to review the results.   Testing/Procedures: None Ordered   Follow-Up: At [facility], you and your health needs are our priority.  As part of our continuing mission to provide you with exceptional heart care, we have created designated Provider Care Teams.  These Care Teams include your primary Cardiologist (physician) and Advanced Practice Providers (APPs -  Physician Assistants and Nurse Practitioners) who all work together to provide you with the care you need, when you need it.  We recommend signing up for the patient portal called "MyChart".  Sign up information is provided on this After Visit Summary.  MyChart is used to connect with patients for Virtual Visits (Telemedicine).  Patients are able to view lab/test results, encounter notes, upcoming appointments, etc.  Non-urgent messages can be sent to your provider as well.   To learn more about what you can do with MyChart, go to [email or web address].    Your next appointment:   6 month(s)  The format for your next appointment:   In Person  Provider:   Festus Barren   Other Instructions Referral to Pharm D- they will call for appt

## 2023-08-23 LAB — LIPID PANEL
Chol/HDL Ratio: 3.2 ratio (ref 0.0–4.4)
Cholesterol, Total: 148 mg/dL (ref 100–199)
HDL: 46 mg/dL
LDL Chol Calc (NIH): 71 mg/dL (ref 0–99)
Triglycerides: 188 mg/dL — ABNORMAL HIGH (ref 0–149)
VLDL Cholesterol Cal: 31 mg/dL (ref 5–40)

## 2023-08-23 LAB — COMPREHENSIVE METABOLIC PANEL WITH GFR
ALT: 21 IU/L (ref 0–32)
AST: 20 IU/L (ref 0–40)
Albumin: 4.2 g/dL (ref 3.9–4.9)
Alkaline Phosphatase: 79 IU/L (ref 44–121)
BUN/Creatinine Ratio: 20 (ref 12–28)
BUN: 16 mg/dL (ref 8–27)
Bilirubin Total: 0.3 mg/dL (ref 0.0–1.2)
CO2: 24 mmol/L (ref 20–29)
Calcium: 9.2 mg/dL (ref 8.7–10.3)
Chloride: 104 mmol/L (ref 96–106)
Creatinine, Ser: 0.8 mg/dL (ref 0.57–1.00)
Globulin, Total: 2.4 g/dL (ref 1.5–4.5)
Glucose: 101 mg/dL — ABNORMAL HIGH (ref 70–99)
Potassium: 4.2 mmol/L (ref 3.5–5.2)
Sodium: 142 mmol/L (ref 134–144)
Total Protein: 6.6 g/dL (ref 6.0–8.5)
eGFR: 81 mL/min/1.73

## 2023-10-24 ENCOUNTER — Other Ambulatory Visit (HOSPITAL_COMMUNITY): Payer: Self-pay

## 2023-10-25 ENCOUNTER — Ambulatory Visit: Attending: Cardiology | Admitting: Pharmacist

## 2023-10-25 DIAGNOSIS — Z6839 Body mass index (BMI) 39.0-39.9, adult: Secondary | ICD-10-CM | POA: Diagnosis not present

## 2023-10-25 DIAGNOSIS — E66812 Obesity, class 2: Secondary | ICD-10-CM | POA: Diagnosis not present

## 2023-10-25 DIAGNOSIS — E661 Drug-induced obesity: Secondary | ICD-10-CM

## 2023-10-25 NOTE — Progress Notes (Signed)
 Patient ID: Marie Hughes                 DOB: 1955-11-10                    MRN: [id number]     HPI: Marie Hughes is a 68 y.o. female patient of Dr. Harlan Liberty, referred to pharmacy clinic by Pattricia Bores, NP to initiate GLP1-RA therapy. PMH is significant for onobstructive CAD, hypertension, hyperlipidemia, prediabetes, obesity, LBBB, depression, and obesity. Most recent BMI 40.8.  Patient presents today to Pharm.D. clinic.  She sees someone for weight loss and her endocrinology office.  They tried to get her Frederik Jansky but her insurance will not cover it.  Cash price options are not affordable for patient.  Of note she does have an A1c of 6.5 on 12/17/2020.  She had been going to the gym 2 days a week but the last 3 weeks she has not gone at all due to knee pain.  She is concerned because 2 of her half sisters had thyroid cancer.  They do not know what type of thyroid cancer it was.  And per patient did not have access to the records.  No history of pancreatitis or gallstones.  [place] a [address] town, only food options are [employer] and [employer].  She often times skips breakfast.  Does like her sweets.  Wakes up every few hours at night sometimes will go and eat 1/4 cup of ice cream or pickle.  Has only been able to lose a few pounds on her own.  Does not count her track food or calories.  Although the weight loss center does give her a calorie amount.  She is 50-50 on whether she really wants to use weight loss medications.   Diet: breakfast: often skips- sometimes atkins breakfast shake Lunch: small fry and drink (coke) Dinner: pork chop, corn, salad, bake potatoe w/ salad, slaw fried fish, baked potatoes Will wake up during the night and eat Drink: water, coffee (black), Coke (7oz at least 3 times a week)  Exercise: walk 1 mile (but not much in the last week), some arm/leg/back exercises   Family History:  Family History  Problem Relation Age of Onset   Heart attack Mother     Pneumonia Mother    Coronary artery disease Father    Other Father        CABG X65 age 77 & 82   Other Brother        CABG   Heart attack Paternal Grandmother      Social History: no ETOH, former smoker  Labs: Lab Results  Component Value Date   HGBA1C 6.2 (H) 09/07/2021    Wt Readings from Last 1 Encounters:  08/22/23 216 lb (98 kg)    BP Readings from Last 1 Encounters:  08/22/23 (!) 140/80   Pulse Readings from Last 1 Encounters:  08/22/23 64       Component Value Date/Time   CHOL 148 08/22/2023 0922   TRIG 188 (H) 08/22/2023 0922   HDL 46 08/22/2023 0922   CHOLHDL 3.2 08/22/2023 0922   LDLCALC 71 08/22/2023 0922   LDLDIRECT 88 12/23/2022 1122    Past Medical History:  Diagnosis Date   Acute cystitis without hematuria 07/30/2019   Last Assessment & Plan:  Formatting of this note might be different from the original. UA most consistent with uncomplicated UTI. No red flag features to suggest need for imaging, special testing or urgent specialty  consultation at this time. Antibiotic started per visit orders. Will send UA for culture to confirm sensitivity to abx given. Recommend f/u with PCP or return to clinic or ED for worse   Arthralgia of knee, left 05/17/2019   Last Assessment & Plan:  Formatting of this note might be different from the original. Recently provided intra-articular injection is done very well, could repeat in the future if persistent symptoms.   Arthropathy, unspecified 11/01/2022   Last Assessment & Plan: Formatting of this note might be different from the original. Problem complexity: Chronic problem/illness, stable Assessment:  she endorses chronic pain of joints in multiple sites. Plan: labs and will advise accordingly.     Bilateral carotid bruits 10/17/2019   Bilateral leg cramps 12/07/2021   Last Assessment & Plan: Formatting of this note might be different from the original. New to provider. Problem complexity: Chronic problem/illness, with  progression Assessment:  Nocturnal BLE cramps for several months. This is likely multifactoral given her spinal disease and possible hypokalemia. Plan: labs today and will advise accordingly. Supportive care discussed.     Bladder problem    Cervical post-laminectomy syndrome 08/21/2017   Last Assessment & Plan:  Formatting of this note might be different from the original. Multilevel cervical fusion with continued neck and right upper extremity radicular pain.  Stable at this time without any progression, we will hold on any further imaging at this time.   Chest pain 12/21/2017   Last Assessment & Plan:  Intermittent. Non-radiating. No N/V/D. No vision changes.  Last Assessment & Plan:  Formatting of this note might be different from the original. No complaints of chest pain since hospital discharge.   Chronic foot pain, right 01/24/2020   Last Assessment & Plan:  Formatting of this note might be different from the original. 68 year old female with chronic pain of multiple etiologies, at this time most limiting complaint is right foot pain secondary to refractory plantar fasciitis status post surgical intervention.  She is followed by Podiatry.  Today I recommended obtaining a more appropriate supportive footwear, we have also discu   Chronic left shoulder pain 06/19/2020   Last Assessment & Plan:  Formatting of this note might be different from the original. Recently referred to orthopedics by PCP, several weeks status post intra-articular injection with excellent benefit.  I did let patient know that I am happy to repeat that procedure in the future should she need it.   Chronic left-sided thoracic back pain 07/14/2022   Chronic pain of right ankle 06/19/2020   Chronic right shoulder pain 11/12/2018   Last Assessment & Plan:  Formatting of this note might be different from the original. Today we will take plain films of the shoulder to further evaluate etiology of her pain.  Discussed with patient  that it is very likely radicular in nature, as I do not have any findings on exam suspicious of true shoulder etiology.   Chronically on opiate therapy 12/20/2016   Last Assessment & Plan:  Risks of this type of medication including the risk of addiction and, if misused, of death are reviewed with the patient.  The patient feels the benefits of pain relief and increased ability to engage in self-care activities improve the quality of life to outweigh these risks.  Last Assessment & Plan:  Formatting of this note might be different from the original. Will cont   Class 2 drug-induced obesity with serious comorbidity and body mass index (BMI) of 39.0 to 39.9 in adult  12/21/2017   Last Assessment & Plan:  Formatting of this note might be different from the original. BMI Assessment: Current Body mass index is 39.91 kg/m.  Patient BMI currently is above average (>25 kg/m2); BMI follow up plan is completed . Current barriers to healthy weight management include none.  BMI Plan:  Today, Rakeya and I have discussed methods to help address her current weight.   General weight l   Class 2 severe obesity due to excess calories with serious comorbidity and body mass index (BMI) of 39.0 to 39.9 in adult [facility]) 06/19/2020   Last Assessment & Plan:  Formatting of this note might be different from the original. BMI Assessment: Current Body mass index is 39.68 kg/m.  Patient BMI currently is above average (>25 kg/m2); BMI follow up plan is completed . Current barriers to healthy weight management include pain.  BMI Plan:  Today, Torrey and I have discussed methods to help address her current weight.   General weight l   Complete tear of rotator cuff 07/14/2022   Coronary artery disease involving native coronary artery of native heart with unstable angina pectoris (HCC) 09/12/2018   Last Assessment & Plan:  Formatting of this note might be different from the original. She is followed by cardiology and has an upcoming  appointment with them.   Advised to continue current medications.   DDD (degenerative disc disease), lumbar 08/21/2017   Last Assessment & Plan:  Stble. No exacerbation of symptoms.  Last Assessment & Plan:  Formatting of this note might be different from the original. Chronic back pain and bilateral lower extremity radicular symptoms that appear to correspond to L4/5 distribution. Review of most recent lumbar MRI from 2013 shows multilevel degenerative changes most prominent at L4-5 where there is mild spinal steno   Degenerative cervical spinal stenosis 01/05/2016   Last Assessment & Plan:  Formatting of this note might be different from the original. We will continue current medications.  Patient understands she may benefit percutaneous interventions.  Patient has had previous cervical spine surgeries.   Depression    Diabetes mellitus due to underlying condition with unspecified complications (HCC) 04/12/2021   Dietary counseling 04/17/2020   Diverticulosis 07/14/2022   Dizziness 12/18/2020   Last Assessment & Plan:  Formatting of this note might be different from the original. Patient complains of dizziness and overall not feeling well.  She does complain of feelings of hot and cold as well as sweats.  She admits working at the [employer] is reunion in the direct heat.  She did this for several days.  She feels she may be dehydrated somewhat.  She has no focal neuro def   Dyspnea on exertion 04/12/2020   Dysuria 07/24/2019   Last Assessment & Plan:  Formatting of this note might be different from the original. Assessment  Pain intermittent in the suprapubic. Mild backpain. Symptoms "come and go", then come back. Hx of kidney stone.  Reports weak urine stream Afebrile, no chills This may be a recurrence of kidney stone UA +wbc 75, will culture  Plan  Will start Flomax and await the urine culture   Elevated blood pressure reading 07/29/2021   Last Assessment & Plan:   Formatting of  this note might be different from the original.  Blood pressure elevated in clinic today.  Patient will continue to follow-up with his primary care provider for further management.     Encounter for screening mammogram for breast cancer 12/18/2019  Essential hypertension 01/05/2016   Last Assessment & Plan:  Formatting of this note might be different from the original. Blood pressure elevated today-follow-up with primary care for further management.   Exposure to 2019 novel coronavirus 07/01/2019   Last Assessment & Plan:  Formatting of this note might be different from the original. POCT Sars-Cov-2 Sophia NEGATIVE POCT Sars-COV-2 Aptima-COV  I have explained to the patient they must quarantine according to [facility] current guideline. They are encouraged to protect others in the household by wearing a mask, covering cough, and cleansing any area used by the patient, and if possible quarantine in    Heart disease 12/21/2017   Last Assessment & Plan:  No reports of chest pain in approximately 8 months. Will refill Nitro SL tabs.  Last Assessment & Plan:  Formatting of this note might be different from the original. Complaining of more frequent episodes of dyspnea on exertion, and bilateral leg swelling which are chronic concerns for her.  Advise she follow-up with her PCP in regards to these concerns for further evaluat   Hematuria of unknown etiology 07/14/2022   Last Assessment & Plan: Formatting of this note might be different from the original. Patient complaining of left flank pain that is chronic.  However, we obtained a urine to rule out an underlying cystitis or pyelonephritis.  Her urine did show some hematuria.  We will send for microscopic analysis as well as culture.     History of COVID-19 04/12/2022   Last Assessment & Plan: Formatting of this note might be different from the original. Acute illness that is progressing. Diagnosis COVID 19 per rapid testing. Plan Discussed the importance of  avoiding unnecessary antibiotic therapy. Suggested symptomatic OTC remedies. OTC antihistamine for rash. Discussed paxlovid. She defers at this time and requests to manage without pharmacotherapy. She has no    Hypertension    Hypertensive urgency 01/05/2016   Last Assessment & Plan:  Formatting of this note might be different from the original. Blood pressure is controlled today. 128/70. Advise to continue monitoring. Notify office for elevation. Continue current medications and f/u with cardiology.   IFG (impaired fasting glucose) 12/21/2017   Last Assessment & Plan:  Monitoring with blood work. POC according to results.'  Last Assessment & Plan:  Formatting of this note is different from the original. Lab Results  Component Value Date   HGBA1C 6.2 12/21/2017   Prediabetic. Monitoring with BW  Monitoring with blood work  Change in diet including a whole plant based diet, limiting meats especially red meat encouraged.   Avoiding unhea   Impingement syndrome of left shoulder 07/14/2022   Lack of energy 11/01/2022   Last Assessment & Plan: Formatting of this note might be different from the original. Undiagnosed problem with uncertain prognosis. Assessment: complains of chronic fatigue. Plan: labs and will advise accordingly     Left bundle branch block (LBBB) 10/17/2019   Formatting of this note might be different from the original. Noted to have LBBB in old records. LBBB mentioned in cardiologist's office note 10/17/19.  Last Assessment & Plan:  Formatting of this note might be different from the original. This is chronic and was noted on recent stress test. She is following with cardiology. Advised to proceed with next appointment.  She is asymptomatic today.   Left upper quadrant pain 08/03/2021   Last Assessment & Plan:   Formatting of this note might be different from the original.  Problem complexity: Chronic problem/illness, with progression  Assessment:  Left upper quadrant pain for several  months. She is fearful may have pancreatic cancer due to significant family hx of similar presentation. She is scheduled for MRI of the thoracic spine this week to evaluate for issues of the spin   Localized swelling of both lower legs 04/13/2021   Last Assessment & Plan:   Formatting of this note might be different from the original.  Patient complains of an approximate 2 month onset of bilateral lower extremity swelling.  However, upon exam I did not note any changes from previous exam.  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  There was no pitting.  Weight is stable without significant weight gain.  Her lungs were clear to ausc   Long term (current) use of opiate analgesic 12/20/2016   Last Assessment & Plan: Formatting of this note might be different from the original. Patient and I have discussed the hazardous effects of continued opiate pain medication usage. Risks and benefits of above medications including but not limited to possibility of hyperalgesia, respiratory depression, sedation, and even death were discussed with the patient who expressed an understanding. Patient i   Long term use of drug 12/18/2019   Lumbar radiculitis 01/05/2016   Last Assessment & Plan:  Formatting of this note might be different from the original. Patient will continue with current medications.  Patient understands she may benefit percutaneous interventions.   Mass of right foot 07/14/2022   Mixed hyperlipidemia 01/05/2016   Last Assessment & Plan:  Formatting of this note is different from the original. Problem Complexity:  Chronic problem/illness, stable  Pertinent Data: LDL Calculated (mg/dL)  Date Value  64/40/3474 48   Cholesterol (mg/dl)  Date Value  25/95/6387 135   HDL (mg/dl)  Date Value  56/43/3295 34.4 (L)   Triglycerides (mg/dl)  Date Value  18/84/1660 261 (H)   AST (U/L)  Date Value  12/18/2019 20   ALT (   Morbid obesity (HCC) 04/16/2018   Muscle pain    Muscle spasm of back 08/21/2017    Last Assessment & Plan:  Formatting of this note might be different from the original. Continue p.r.n. Soma   MVP (mitral valve prolapse) 07/14/2022   Nausea 12/18/2020   Nephrolithiasis 07/14/2022   Nocturia 07/14/2022   Obesity due to excess calories 01/05/2016   Last Assessment & Plan:  Formatting of this note might be different from the original. Diet was discussed and reviewed with the patient. Modify diet to eliminate bad fats (fast food, junk foods) and increase fiber intake. Regular exercise several times per week as appropriate, along with proper diet will help to improve overall health. Discussion concerning the health risk of obesity educated. Edu   Otalgia, left ear 12/18/2019   Last Assessment & Plan:  Formatting of this note might be different from the original. Physical examination revealed a normal exam.  No evidence of infection.  No evidence of cerumen impaction.   Other chronic pain 08/21/2017   Last Assessment & Plan:  CORNELIUS DIANE is a 68 y.o.  year old female with Low back pain, Neck pain, due to Multilevel multifactorial degenerative changes in the lumbar and cervical spine, resulting in lower extremity radiculopathy bilaterally, in the setting of previous surgery in the cervical spine. Today I will refill the patient's current medications of Hydrocodone 10/325 mg PO PRN qid, and S   Pain and swelling of lower extremity, right 11/12/2018   Last Assessment & Plan:  Formatting of this  note might be different from the original. Due to patient's recent onset of worsening lower extremity pain, with increased unilateral swelling and diffuse calf tenderness of the right extremity, we recommend she go for a right lower extremity ultrasound to rule out a DVT.   Pain medication agreement 08/10/2018   Last Assessment & Plan:  There is a current opioid agreement on file within the clinic and urine drug screens will be collected as needed to be in compliance with clinic policies.  Last  Assessment & Plan:  Formatting of this note might be different from the original. Review of prior UDS results are consistent with regimen.  [place]  controlled substance registry reviewed and is consistent wi   Plantar fasciitis, right 07/14/2022   Postmenopausal state 10/11/2021   Pre-syncope 04/12/2020   Last Assessment & Plan:  Formatting of this note might be different from the original. No further episodes since hospital discharge.   Recurrent cystitis 12/18/2019   Recurrent major depressive disorder, in partial remission (HCC) 01/05/2016   Last Assessment & Plan:  Formatting of this note might be different from the original. This is a chronic problem and is stable.  She denies suicidal or homicidal ideations.  Advised to continue current medications.   Recurrent UTI 07/14/2022   Suspected 2019 novel coronavirus infection 12/18/2020   Swelling    Tendinitis of left rotator cuff 07/14/2022   Thoracic radiculopathy 02/11/2019   Last Assessment & Plan:  Formatting of this note might be different from the original. Plain films of the thoracic spine completed after last appointment show ACDF with mid lower cervical spine with thoracic vertebral body alignment and disc spaces normal.  Responded well to previous oral steroids.  Have discussed possibility of C7-T1 CESI should her symptoms return.   Type 2 diabetes mellitus without complication, without long-term current use of insulin (HCC) 12/17/2020   Last Assessment & Plan:  Formatting of this note might be different from the original. Encourage her today to focus on losing weight, decreasing carbohydrate intake.  Hopefully she can make some improvements here to get this better controlled.   Uncomplicated opioid dependence (HCC) 01/05/2016   Viral syndrome 12/18/2020   Vitamin D deficiency 12/18/2019    Current Outpatient Medications on File Prior to Visit  Medication Sig Dispense Refill   amitriptyline (ELAVIL) 10 MG tablet Take 10 mg  by mouth at bedtime.     amLODipine (NORVASC) 10 MG tablet Take 10 mg by mouth every evening.      aspirin  EC 81 MG tablet Take 81 mg by mouth daily.     carisoprodol (SOMA) 350 MG tablet Take 350 mg by mouth daily as needed for muscle spasms.   0   carvedilol  (COREG ) 25 MG tablet Take 1 tablet (25 mg total) by mouth 2 (two) times daily with a meal. 180 tablet 3   Coenzyme Q10 100 MG capsule Take 100 mg by mouth daily.     Dietary Management Product (VASCULERA) TABS Take 1 tablet by mouth daily.     ezetimibe  (ZETIA ) 10 MG tablet Take 1 tablet (10 mg total) by mouth daily. 90 tablet 3   HYDROcodone-acetaminophen  (NORCO) 10-325 MG per tablet Take 1 tablet by mouth every 6 (six) hours as needed for moderate pain.  0   isosorbide  mononitrate (IMDUR ) 30 MG 24 hr tablet Take 1 tablet (30 mg total) by mouth daily. 90 tablet 3   KLOR-CON M20 20 MEQ tablet Take 20 mEq by mouth daily at 3  pm.   0   lisinopril-hydrochlorothiazide (PRINZIDE,ZESTORETIC) 20-12.5 MG per tablet Take 1 tablet by mouth daily.     Multiple Vitamins-Minerals (ADULT GUMMY PO) Take 1 tablet by mouth 2 (two) times a week.     nitroGLYCERIN  (NITROSTAT ) 0.4 MG SL tablet Place 1 tablet (0.4 mg total) under the tongue every 5 (five) minutes as needed for chest pain. 25 tablet 6   OVER THE COUNTER MEDICATION Take 1 tablet by mouth daily. B12, Calcium  and D3     rosuvastatin  (CRESTOR ) 10 MG tablet Take 10 mg (1 tablet) in the morning and 20 mg (2 tablets) in the evening. 270 tablet 3   No current facility-administered medications on file prior to visit.    Allergies  Allergen Reactions   Nitrofurantoin Other (See Comments)    Nausea, abdominal pain, headache. Symptoms occurred twice with this antibiotic, resolving with discontinued use     Assessment/Plan:  1. Weight loss - Patient has not met goal of at least 5% of body weight loss with comprehensive lifestyle modifications alone in the past 3-6 months.  I feel as though more  could be done with patient's diet and exercise.  Patient in agreement with this statement.  We talked about making small changes.  Does not have to cut out food she enjoys and reasonable quantities.  We discussed focusing on increasing lean proteins such as chicken fish beings nuts seeds and increasing fiber intake.  Handout given to patient on this.  We talked about how both fiber and protein can help us  feel fuller for longer.  Cannot confirm that patient has no personal or family history of medullary thyroid carcinoma (MTC) or Multiple Endocrine Neoplasia syndrome type 2 (MEN 2).  No history of pancreatitis or gallstones.   Patient opted for continuation and enhancement of lifestyle changes.  I advised that I would like to see her go to the gym more frequently.  Set small goals such as this week I will go to the gym 2 days next week I will go to the gym 3 days and so on.  Recommended cooking extra dinner to have available for lunch the next day.  Also talked about keeping frozen vegetables and maybe some grilled chicken or some canned beans on hand for a quick healthy meal.  Handout on some possible meal options given to patient.   Ayah Cozzolino D Kynadi Dragos, Pharm.Monika Annas, CPP [facility] of  [facility] [address] [place], [place] [address]  Phone: [phone]; Fax: [phone]

## 2024-02-26 ENCOUNTER — Ambulatory Visit: Attending: Cardiology

## 2024-02-26 ENCOUNTER — Ambulatory Visit: Attending: Cardiology | Admitting: Cardiology

## 2024-02-26 ENCOUNTER — Encounter: Payer: Self-pay | Admitting: Cardiology

## 2024-02-26 VITALS — BP 130/80 | HR 52 | Ht 61.0 in | Wt 210.0 lb

## 2024-02-26 DIAGNOSIS — I341 Nonrheumatic mitral (valve) prolapse: Secondary | ICD-10-CM | POA: Diagnosis not present

## 2024-02-26 DIAGNOSIS — R002 Palpitations: Secondary | ICD-10-CM

## 2024-02-26 DIAGNOSIS — R918 Other nonspecific abnormal finding of lung field: Secondary | ICD-10-CM

## 2024-02-26 DIAGNOSIS — I10 Essential (primary) hypertension: Secondary | ICD-10-CM | POA: Diagnosis not present

## 2024-02-26 DIAGNOSIS — I447 Left bundle-branch block, unspecified: Secondary | ICD-10-CM | POA: Diagnosis not present

## 2024-02-26 DIAGNOSIS — E119 Type 2 diabetes mellitus without complications: Secondary | ICD-10-CM

## 2024-02-26 DIAGNOSIS — I25110 Atherosclerotic heart disease of native coronary artery with unstable angina pectoris: Secondary | ICD-10-CM

## 2024-02-26 NOTE — Progress Notes (Unsigned)
 Cardiology Office Note:    Date:  02/26/2024   ID:  Marie Hughes, DOB Dec 02, 1955, MRN [id number]  PCP:  Zackary Reena CROME, FNP  Cardiologist:  Lamar Fitch, MD    Referring MD: Zackary Reena CROME, FNP   No chief complaint on file. I have dizzy spells and palpitations  History of Present Illness:    Marie Hughes is a 68 y.o. female past medical history significant for coronary artery disease last evaluation in 201 22 July 2023 which showed 87% total of calcium  score, FFR was negative, moderate stenosis of the proximal mid LAD, additional problem include left bundle branch, depression, obesity, hyperlipidemia, prediabetes.  Comes today to months for follow-up she complained of having dizzy spells with palpitations she said she can be standing started having dizziness it happened 1 time at church sometimes she feels like she had to go to the restroom after that.  She never completely passed out.  Also that is associated with sensation of heart speeding up.  Still described to have some rare episode of chest pain.  Usually does not take nitroglycerin  for it.  Overall stable from that aspect.  Past Medical History:  Diagnosis Date   Acute cystitis without hematuria 07/30/2019   Last Assessment & Plan:  Formatting of this note might be different from the original. UA most consistent with uncomplicated UTI. No red flag features to suggest need for imaging, special testing or urgent specialty consultation at this time. Antibiotic started per visit orders. Will send UA for culture to confirm sensitivity to abx given. Recommend f/u with PCP or return to clinic or ED for worse   Arthralgia of knee, left 05/17/2019   Last Assessment & Plan:  Formatting of this note might be different from the original. Recently provided intra-articular injection is done very well, could repeat in the future if persistent symptoms.   Arthropathy, unspecified 11/01/2022   Last Assessment & Plan: Formatting of  this note might be different from the original. Problem complexity: Chronic problem/illness, stable Assessment:  she endorses chronic pain of joints in multiple sites. Plan: labs and will advise accordingly.     Bilateral carotid bruits 10/17/2019   Bilateral leg cramps 12/07/2021   Last Assessment & Plan: Formatting of this note might be different from the original. New to provider. Problem complexity: Chronic problem/illness, with progression Assessment:  Nocturnal BLE cramps for several months. This is likely multifactoral given her spinal disease and possible hypokalemia. Plan: labs today and will advise accordingly. Supportive care discussed.     Bladder problem    Cervical post-laminectomy syndrome 08/21/2017   Last Assessment & Plan:  Formatting of this note might be different from the original. Multilevel cervical fusion with continued neck and right upper extremity radicular pain.  Stable at this time without any progression, we will hold on any further imaging at this time.   Chest pain 12/21/2017   Last Assessment & Plan:  Intermittent. Non-radiating. No N/V/D. No vision changes.  Last Assessment & Plan:  Formatting of this note might be different from the original. No complaints of chest pain since hospital discharge.   Chronic foot pain, right 01/24/2020   Last Assessment & Plan:  Formatting of this note might be different from the original. 68 year old female with chronic pain of multiple etiologies, at this time most limiting complaint is right foot pain secondary to refractory plantar fasciitis status post surgical intervention.  She is followed by Podiatry.  Today I recommended obtaining a more appropriate supportive  footwear, we have also discu   Chronic left shoulder pain 06/19/2020   Last Assessment & Plan:  Formatting of this note might be different from the original. Recently referred to orthopedics by PCP, several weeks status post intra-articular injection with excellent benefit.   I did let patient know that I am happy to repeat that procedure in the future should she need it.   Chronic left-sided thoracic back pain 07/14/2022   Chronic pain of right ankle 06/19/2020   Chronic right shoulder pain 11/12/2018   Last Assessment & Plan:  Formatting of this note might be different from the original. Today we will take plain films of the shoulder to further evaluate etiology of her pain.  Discussed with patient that it is very likely radicular in nature, as I do not have any findings on exam suspicious of true shoulder etiology.   Chronically on opiate therapy 12/20/2016   Last Assessment & Plan:  Risks of this type of medication including the risk of addiction and, if misused, of death are reviewed with the patient.  The patient feels the benefits of pain relief and increased ability to engage in self-care activities improve the quality of life to outweigh these risks.  Last Assessment & Plan:  Formatting of this note might be different from the original. Will cont   Class 2 drug-induced obesity with serious comorbidity and body mass index (BMI) of 39.0 to 39.9 in adult 12/21/2017   Last Assessment & Plan:  Formatting of this note might be different from the original. BMI Assessment: Current Body mass index is 39.91 kg/m.  Patient BMI currently is above average (>25 kg/m2); BMI follow up plan is completed . Current barriers to healthy weight management include none.  BMI Plan:  Today, Marie Hughes and I have discussed methods to help address her current weight.   General weight l   Class 2 severe obesity due to excess calories with serious comorbidity and body mass index (BMI) of 39.0 to 39.9 in adult [facility]) 06/19/2020   Last Assessment & Plan:  Formatting of this note might be different from the original. BMI Assessment: Current Body mass index is 39.68 kg/m.  Patient BMI currently is above average (>25 kg/m2); BMI follow up plan is completed . Current barriers to healthy weight management  include pain.  BMI Plan:  Today, Marie Hughes and I have discussed methods to help address her current weight.   General weight l   Complete tear of rotator cuff 07/14/2022   Coronary artery disease involving native coronary artery of native heart with unstable angina pectoris (HCC) 09/12/2018   Last Assessment & Plan:  Formatting of this note might be different from the original. She is followed by cardiology and has an upcoming appointment with them.   Advised to continue current medications.   DDD (degenerative disc disease), lumbar 08/21/2017   Last Assessment & Plan:  Stble. No exacerbation of symptoms.  Last Assessment & Plan:  Formatting of this note might be different from the original. Chronic back pain and bilateral lower extremity radicular symptoms that appear to correspond to L4/5 distribution. Review of most recent lumbar MRI from 2013 shows multilevel degenerative changes most prominent at L4-5 where there is mild spinal steno   Degenerative cervical spinal stenosis 01/05/2016   Last Assessment & Plan:  Formatting of this note might be different from the original. We will continue current medications.  Patient understands she may benefit percutaneous interventions.  Patient has had previous cervical spine surgeries.  Depression    Diabetes mellitus due to underlying condition with unspecified complications (HCC) 04/12/2021   Dietary counseling 04/17/2020   Diverticulosis 07/14/2022   Dizziness 12/18/2020   Last Assessment & Plan:  Formatting of this note might be different from the original. Patient complains of dizziness and overall not feeling well.  She does complain of feelings of hot and cold as well as sweats.  She admits working at the [employer] is reunion in the direct heat.  She did this for several days.  She feels she may be dehydrated somewhat.  She has no focal neuro def   Dyspnea on exertion 04/12/2020   Dysuria 07/24/2019   Last Assessment & Plan:  Formatting  of this note might be different from the original. Assessment  Pain intermittent in the suprapubic. Mild backpain. Symptoms come and go, then come back. Hx of kidney stone.  Reports weak urine stream Afebrile, no chills This may be a recurrence of kidney stone UA +wbc 75, will culture  Plan  Will start Flomax and await the urine culture   Elevated blood pressure reading 07/29/2021   Last Assessment & Plan:   Formatting of this note might be different from the original.  Blood pressure elevated in clinic today.  Patient will continue to follow-up with his primary care provider for further management.     Encounter for screening mammogram for breast cancer 12/18/2019   Essential hypertension 01/05/2016   Last Assessment & Plan:  Formatting of this note might be different from the original. Blood pressure elevated today-follow-up with primary care for further management.   Exposure to 2019 novel coronavirus 07/01/2019   Last Assessment & Plan:  Formatting of this note might be different from the original. POCT Sars-Cov-2 Sophia NEGATIVE POCT Sars-COV-2 Aptima-COV  I have explained to the patient they must quarantine according to [facility] current guideline. They are encouraged to protect others in the household by wearing a mask, covering cough, and cleansing any area used by the patient, and if possible quarantine in    Heart disease 12/21/2017   Last Assessment & Plan:  No reports of chest pain in approximately 8 months. Will refill Nitro SL tabs.  Last Assessment & Plan:  Formatting of this note might be different from the original. Complaining of more frequent episodes of dyspnea on exertion, and bilateral leg swelling which are chronic concerns for her.  Advise she follow-up with her PCP in regards to these concerns for further evaluat   Hematuria of unknown etiology 07/14/2022   Last Assessment & Plan: Formatting of this note might be different from the original. Patient complaining of left flank pain that  is chronic.  However, we obtained a urine to rule out an underlying cystitis or pyelonephritis.  Her urine did show some hematuria.  We will send for microscopic analysis as well as culture.     History of COVID-19 04/12/2022   Last Assessment & Plan: Formatting of this note might be different from the original. Acute illness that is progressing. Diagnosis COVID 19 per rapid testing. Plan Discussed the importance of avoiding unnecessary antibiotic therapy. Suggested symptomatic OTC remedies. OTC antihistamine for rash. Discussed paxlovid. She defers at this time and requests to manage without pharmacotherapy. She has no    Hypertension    Hypertensive urgency 01/05/2016   Last Assessment & Plan:  Formatting of this note might be different from the original. Blood pressure is controlled today. 128/70. Advise to continue monitoring. Notify office  for elevation. Continue current medications and f/u with cardiology.   IFG (impaired fasting glucose) 12/21/2017   Last Assessment & Plan:  Monitoring with blood work. POC according to results.'  Last Assessment & Plan:  Formatting of this note is different from the original. Lab Results  Component Value Date   HGBA1C 6.2 12/21/2017   Prediabetic. Monitoring with BW  Monitoring with blood work  Change in diet including a whole plant based diet, limiting meats especially red meat encouraged.   Avoiding unhea   Impingement syndrome of left shoulder 07/14/2022   Lack of energy 11/01/2022   Last Assessment & Plan: Formatting of this note might be different from the original. Undiagnosed problem with uncertain prognosis. Assessment: complains of chronic fatigue. Plan: labs and will advise accordingly     Left bundle branch block (LBBB) 10/17/2019   Formatting of this note might be different from the original. Noted to have LBBB in old records. LBBB mentioned in cardiologist's office note 10/17/19.  Last Assessment & Plan:  Formatting of this note might be different from  the original. This is chronic and was noted on recent stress test. She is following with cardiology. Advised to proceed with next appointment.  She is asymptomatic today.   Left upper quadrant pain 08/03/2021   Last Assessment & Plan:   Formatting of this note might be different from the original.  Problem complexity: Chronic problem/illness, with progression     Assessment:  Left upper quadrant pain for several months. She is fearful may have pancreatic cancer due to significant family hx of similar presentation. She is scheduled for MRI of the thoracic spine this week to evaluate for issues of the spin   Localized swelling of both lower legs 04/13/2021   Last Assessment & Plan:   Formatting of this note might be different from the original.  Patient complains of an approximate 2 month onset of bilateral lower extremity swelling.  However, upon exam I did not note any changes from previous exam.  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  There was no pitting.  Weight is stable without significant weight gain.  Her lungs were clear to ausc   Long term (current) use of opiate analgesic 12/20/2016   Last Assessment & Plan: Formatting of this note might be different from the original. Patient and I have discussed the hazardous effects of continued opiate pain medication usage. Risks and benefits of above medications including but not limited to possibility of hyperalgesia, respiratory depression, sedation, and even death were discussed with the patient who expressed an understanding. Patient i   Long term use of drug 12/18/2019   Lumbar radiculitis 01/05/2016   Last Assessment & Plan:  Formatting of this note might be different from the original. Patient will continue with current medications.  Patient understands she may benefit percutaneous interventions.   Mass of right foot 07/14/2022   Mixed hyperlipidemia 01/05/2016   Last Assessment & Plan:  Formatting of this note is different from the  original. Problem Complexity:  Chronic problem/illness, stable  Pertinent Data: LDL Calculated (mg/dL)  Date Value  88/98/7978 48   Cholesterol (mg/dl)  Date Value  88/98/7978 135   HDL (mg/dl)  Date Value  88/98/7978 34.4 (L)   Triglycerides (mg/dl)  Date Value  88/98/7978 261 (H)   AST (U/L)  Date Value  12/18/2019 20   ALT (   Morbid obesity (HCC) 04/16/2018   Muscle pain    Muscle spasm of back 08/21/2017  Last Assessment & Plan:  Formatting of this note might be different from the original. Continue p.r.n. Soma   MVP (mitral valve prolapse) 07/14/2022   Nausea 12/18/2020   Nephrolithiasis 07/14/2022   Nocturia 07/14/2022   Obesity due to excess calories 01/05/2016   Last Assessment & Plan:  Formatting of this note might be different from the original. Diet was discussed and reviewed with the patient. Modify diet to eliminate bad fats (fast food, junk foods) and increase fiber intake. Regular exercise several times per week as appropriate, along with proper diet will help to improve overall health. Discussion concerning the health risk of obesity educated. Edu   Otalgia, left ear 12/18/2019   Last Assessment & Plan:  Formatting of this note might be different from the original. Physical examination revealed a normal exam.  No evidence of infection.  No evidence of cerumen impaction.   Other chronic pain 08/21/2017   Last Assessment & Plan:  Marie Hughes is a 68 y.o.  year old female with Low back pain, Neck pain, due to Multilevel multifactorial degenerative changes in the lumbar and cervical spine, resulting in lower extremity radiculopathy bilaterally, in the setting of previous surgery in the cervical spine. Today I will refill the patient's current medications of Hydrocodone 10/325 mg PO PRN qid, and S   Pain and swelling of lower extremity, right 11/12/2018   Last Assessment & Plan:  Formatting of this note might be different from the original. Due to patient's recent onset of worsening  lower extremity pain, with increased unilateral swelling and diffuse calf tenderness of the right extremity, we recommend she go for a right lower extremity ultrasound to rule out a DVT.   Pain medication agreement 08/10/2018   Last Assessment & Plan:  There is a current opioid agreement on file within the clinic and urine drug screens will be collected as needed to be in compliance with clinic policies.  Last Assessment & Plan:  Formatting of this note might be different from the original. Review of prior UDS results are consistent with regimen.  [place]  controlled substance registry reviewed and is consistent wi   Plantar fasciitis, right 07/14/2022   Postmenopausal state 10/11/2021   Pre-syncope 04/12/2020   Last Assessment & Plan:  Formatting of this note might be different from the original. No further episodes since hospital discharge.   Recurrent cystitis 12/18/2019   Recurrent major depressive disorder, in partial remission (HCC) 01/05/2016   Last Assessment & Plan:  Formatting of this note might be different from the original. This is a chronic problem and is stable.  She denies suicidal or homicidal ideations.  Advised to continue current medications.   Recurrent UTI 07/14/2022   Suspected 2019 novel coronavirus infection 12/18/2020   Swelling    Tendinitis of left rotator cuff 07/14/2022   Thoracic radiculopathy 02/11/2019   Last Assessment & Plan:  Formatting of this note might be different from the original. Plain films of the thoracic spine completed after last appointment show ACDF with mid lower cervical spine with thoracic vertebral body alignment and disc spaces normal.  Responded well to previous oral steroids.  Have discussed possibility of C7-T1 CESI should her symptoms return.   Type 2 diabetes mellitus without complication, without long-term current use of insulin (HCC) 12/17/2020   Last Assessment & Plan:  Formatting of this note might be different from the original.  Encourage her today to focus on losing weight, decreasing carbohydrate intake.  Hopefully she can make  some improvements here to get this better controlled.   Uncomplicated opioid dependence (HCC) 01/05/2016   Viral syndrome 12/18/2020   Vitamin D deficiency 12/18/2019    Past Surgical History:  Procedure Laterality Date   ABDOMINAL HYSTERECTOMY     FOOT SURGERY     HAND SURGERY     BOTH HANDS   LEFT HEART CATH AND CORONARY ANGIOGRAPHY N/A 08/20/2018   Procedure: LEFT HEART CATH AND CORONARY ANGIOGRAPHY;  Surgeon: Dann Candyce RAMAN, MD;  Location: [facility] INVASIVE CV LAB;  Service: Cardiovascular;  Laterality: N/A;   NECK SURGERIES      Current Medications: Current Meds  Medication Sig   amitriptyline (ELAVIL) 10 MG tablet Take 10 mg by mouth at bedtime.   amLODipine (NORVASC) 10 MG tablet Take 10 mg by mouth every evening.    aspirin  EC 81 MG tablet Take 81 mg by mouth daily.   carvedilol  (COREG ) 25 MG tablet Take 1 tablet (25 mg total) by mouth 2 (two) times daily with a meal.   cholecalciferol (VITAMIN D3) 25 MCG (1000 UNIT) tablet Take 1,000 Units by mouth daily.   Coenzyme Q10 100 MG capsule Take 100 mg by mouth daily.   ezetimibe  (ZETIA ) 10 MG tablet Take 1 tablet (10 mg total) by mouth daily.   HYDROcodone-acetaminophen  (NORCO) 10-325 MG per tablet Take 1 tablet by mouth every 6 (six) hours as needed for moderate pain.   isosorbide  mononitrate (IMDUR ) 30 MG 24 hr tablet Take 1 tablet (30 mg total) by mouth daily.   KLOR-CON M20 20 MEQ tablet Take 20 mEq by mouth daily at 3 pm.    lisinopril-hydrochlorothiazide (PRINZIDE,ZESTORETIC) 20-12.5 MG per tablet Take 1 tablet by mouth daily.   Methylcobalamin (B-12) 5000 MCG TBDP Take 5,000 mcg by mouth daily.   Multiple Vitamins-Minerals (ADULT GUMMY PO) Take 1 tablet by mouth 2 (two) times a week.   nitroGLYCERIN  (NITROSTAT ) 0.4 MG SL tablet Place 1 tablet (0.4 mg total) under the tongue every 5 (five) minutes as needed for chest pain.    rosuvastatin  (CRESTOR ) 10 MG tablet Take 10 mg by mouth daily.     Allergies:   Nitrofurantoin   Social History   Socioeconomic History   Marital status: Widowed    Spouse name: Not on file   Number of children: Not on file   Years of education: Not on file   Highest education level: Not on file  Occupational History   Not on file  Tobacco Use   Smoking status: Former    Current packs/day: 0.00    Types: Cigarettes    Quit date: 2008    Years since quitting: 17.7   Smokeless tobacco: Never  Substance and Sexual Activity   Alcohol  use: No    Alcohol /week: 0.0 standard drinks of alcohol    Drug use: No   Sexual activity: Not on file  Other Topics Concern   Not on file  Social History Narrative   Not on file   Social Drivers of Health   Financial Resource Strain: Not on file  Food Insecurity: Not on file  Transportation Needs: Not on file  Physical Activity: Not on file  Stress: Not on file  Social Connections: Not on file     Family History: The patient's family history includes Coronary artery disease in her father; Heart attack in her mother and paternal grandmother; Other in her brother and father; Pneumonia in her mother. ROS:   Please see the history of present illness.  All 14 point review of systems negative except as described per history of present illness  EKGs/Labs/Other Studies Reviewed:    EKG Interpretation Date/Time:  Monday February 26 2024 09:46:36 EDT Ventricular Rate:  52 PR Interval:  192 QRS Duration:  154 QT Interval:  474 QTC Calculation: 440 R Axis:   -26  Text Interpretation: Sinus bradycardia Left bundle branch block Abnormal ECG No previous ECGs available Confirmed by Bernie Charleston [phone]) on 02/26/2024 9:48:21 AM    Recent Labs: 08/22/2023: ALT 21; BUN 16; Creatinine, Ser 0.80; Potassium 4.2; Sodium 142  Recent Lipid Panel    Component Value Date/Time   CHOL 148 08/22/2023 0922   TRIG 188 (H) 08/22/2023 0922   HDL  46 08/22/2023 0922   CHOLHDL 3.2 08/22/2023 0922   LDLCALC 71 08/22/2023 0922   LDLDIRECT 88 12/23/2022 1122    Physical Exam:    VS:  BP 130/80   Pulse (!) 52   Ht 5' 1 (1.549 m)   Wt 210 lb (95.3 kg)   SpO2 97%   BMI 39.68 kg/m     Wt Readings from Last 3 Encounters:  02/26/24 210 lb (95.3 kg)  08/22/23 216 lb (98 kg)  07/25/23 218 lb (98.9 kg)     GEN:  Well nourished, well developed in no acute distress HEENT: Normal NECK: No JVD; No carotid bruits LYMPHATICS: No lymphadenopathy CARDIAC: RRR, no murmurs, no rubs, no gallops RESPIRATORY:  Clear to auscultation without rales, wheezing or rhonchi  ABDOMEN: Soft, non-tender, non-distended MUSCULOSKELETAL:  No edema; No deformity  SKIN: Warm and dry LOWER EXTREMITIES: no swelling NEUROLOGIC:  Alert and oriented x 3 PSYCHIATRIC:  Normal affect   ASSESSMENT:    1. Essential hypertension   2. Coronary artery disease involving native coronary artery of native heart with unstable angina pectoris (HCC)   3. Left bundle branch block (LBBB)   4. MVP (mitral valve prolapse)   5. Type 2 diabetes mellitus without complication, without long-term current use of insulin (HCC)    PLAN:    In order of problems listed above:  Coronary artery disease, does have symptoms suggestive stable angina pectoris, we will continue present management.  Ask him to take nitroglycerin  for the pain to see if it helps. Dizziness with palpitations will ask him to wear Zio patch for 2 weeks to see if she has any significant arrhythmia.  She is a little bradycardic on the EKG today but same time she described tachycardia when the sensation of dizziness happen, therefore, we will ask her to have a Zio patch. Left bundle branch block chronic issue. Mitral valve prolapse noted, Prediabetes, I did review K PN which shows last hemoglobin A1c 6.2. Dyslipidemia I did review K PN which show me LDL 71 HDL 46 we will continue present management   Medication  Adjustments/Labs and Tests Ordered: Current medicines are reviewed at length with the patient today.  Concerns regarding medicines are outlined above.  Orders Placed This Encounter  Procedures   EKG 12-Lead   Medication changes: No orders of the defined types were placed in this encounter.   Signed, [place] DOROTHA Bernie, MD, [facility] 02/26/2024 10:01 AM    [facility]

## 2024-02-26 NOTE — Patient Instructions (Addendum)
 Medication Instructions:  Your physician recommends that you continue on your current medications as directed. Please refer to the Current Medication list given to you today.  *If you need a refill on your cardiac medications before your next appointment, please call your pharmacy*   Lab Work: None Ordered If you have labs (blood work) drawn today and your tests are completely normal, you will receive your results only by: MyChart Message (if you have MyChart) OR A paper copy in the mail If you have any lab test that is abnormal or we need to change your treatment, we will call you to review the results.   Testing/Procedures: Chest CT:  Non-Cardiac CT scanning, (CAT scanning), is a noninvasive, special x-ray that produces cross-sectional images of the body using x-rays and a computer. CT scans help physicians diagnose and treat medical conditions. For some CT exams, a contrast material is used to enhance visibility in the area of the body being studied. CT scans provide greater clarity and reveal more details than regular x-ray exams.   WHY IS MY DOCTOR PRESCRIBING ZIO? The Zio system is proven and trusted by physicians to detect and diagnose irregular heart rhythms -- and has been prescribed to hundreds of thousands of patients.  The FDA has cleared the Zio system to monitor for many different kinds of irregular heart rhythms. In a study, physicians were able to reach a diagnosis 90% of the time with the Zio system1.  You can wear the Zio monitor -- a small, discreet, comfortable patch -- during your normal day-to-day activity, including while you sleep, shower, and exercise, while it records every single heartbeat for analysis.  1Barrett, P., et al. Comparison of 24 Hour Holter Monitoring Versus 14 Day Novel Adhesive Patch Electrocardiographic Monitoring. American Journal of Medicine, 2014.  ZIO VS. HOLTER MONITORING The Zio monitor can be comfortably worn for up to 14 days. Holter  monitors can be worn for 24 to 48 hours, limiting the time to record any irregular heart rhythms you may have. Zio is able to capture data for the 51% of patients who have their first symptom-triggered arrhythmia after 48 hours.1  LIVE WITHOUT RESTRICTIONS The Zio ambulatory cardiac monitor is a small, unobtrusive, and water-resistant patch--you might even forget you're wearing it. The Zio monitor records and stores every beat of your heart, whether you're sleeping, working out, or showering.     Follow-Up: At [facility], you and your health needs are our priority.  As part of our continuing mission to provide you with exceptional heart care, we have created designated Provider Care Teams.  These Care Teams include your primary Cardiologist (physician) and Advanced Practice Providers (APPs -  Physician Assistants and Nurse Practitioners) who all work together to provide you with the care you need, when you need it.  We recommend signing up for the patient portal called MyChart.  Sign up information is provided on this After Visit Summary.  MyChart is used to connect with patients for Virtual Visits (Telemedicine).  Patients are able to view lab/test results, encounter notes, upcoming appointments, etc.  Non-urgent messages can be sent to your provider as well.   To learn more about what you can do with MyChart, go to [email or web address].    Your next appointment:   3 month(s)  The format for your next appointment:   In Person  Provider:   Lamar Fitch, MD    Other Instructions NA

## 2024-02-28 ENCOUNTER — Telehealth: Payer: Self-pay | Admitting: Cardiology

## 2024-02-28 NOTE — Telephone Encounter (Signed)
 Pt is calling in regards to upcoming CT. She says she was never made aware she had nodules in her lungs. Pt would like more information on this. Please advise.

## 2024-02-28 NOTE — Telephone Encounter (Signed)
 Spoke with pt regarding CT scan scheduled for lung nodules. Pt stated that she did not know about the nodules. Discussed the results. Pt verbalized understanding and had no further questions.

## 2024-03-06 ENCOUNTER — Ambulatory Visit (HOSPITAL_BASED_OUTPATIENT_CLINIC_OR_DEPARTMENT_OTHER)
Admission: RE | Admit: 2024-03-06 | Discharge: 2024-03-06 | Disposition: A | Discharge status: Home / Self Care | Source: Ambulatory Visit | Attending: Cardiology | Admitting: Cardiology

## 2024-03-06 DIAGNOSIS — R918 Other nonspecific abnormal finding of lung field: Secondary | ICD-10-CM | POA: Diagnosis not present

## 2024-03-15 ENCOUNTER — Ambulatory Visit: Payer: Self-pay | Admitting: Cardiology

## 2024-03-18 ENCOUNTER — Telehealth: Payer: Self-pay

## 2024-03-18 NOTE — Telephone Encounter (Signed)
Left message on My Chart with CT results per Dr. Vanetta Shawl note. Routed to PCP.

## 2024-03-19 ENCOUNTER — Telehealth: Payer: Self-pay

## 2024-03-19 NOTE — Telephone Encounter (Signed)
 Pt viewed CT Chest results on My Chart per Dr. Karry note. Routed to PCP.

## 2024-04-15 DIAGNOSIS — R002 Palpitations: Secondary | ICD-10-CM

## 2024-04-16 ENCOUNTER — Telehealth: Payer: Self-pay

## 2024-04-16 NOTE — Telephone Encounter (Signed)
 Left message on My Chart with monitor results per Dr. Karry note. Routed to PCP

## 2024-04-19 ENCOUNTER — Telehealth: Payer: Self-pay

## 2024-04-19 NOTE — Telephone Encounter (Signed)
 Pt viewed monitor results on My Chart per Dr. Vanetta Shawl note. Routed to PCP.

## 2024-05-28 ENCOUNTER — Ambulatory Visit: Admitting: Cardiology

## 2024-05-28 ENCOUNTER — Encounter: Payer: Self-pay | Admitting: Cardiology

## 2024-05-28 ENCOUNTER — Ambulatory Visit: Attending: Cardiology | Admitting: Cardiology

## 2024-05-28 VITALS — BP 140/90 | HR 61 | Ht 61.0 in | Wt 206.0 lb

## 2024-05-28 DIAGNOSIS — E119 Type 2 diabetes mellitus without complications: Secondary | ICD-10-CM

## 2024-05-28 DIAGNOSIS — I447 Left bundle-branch block, unspecified: Secondary | ICD-10-CM

## 2024-05-28 DIAGNOSIS — R55 Syncope and collapse: Secondary | ICD-10-CM

## 2024-05-28 DIAGNOSIS — I25110 Atherosclerotic heart disease of native coronary artery with unstable angina pectoris: Secondary | ICD-10-CM

## 2024-05-28 NOTE — Progress Notes (Unsigned)
 Cardiology Office Note:    Date:  05/28/2024   ID:  Marie Hughes, DOB 1955/06/18, MRN [id number]  PCP:  Marie Reena CROME, FNP  Cardiologist:  Lamar Fitch, MD    Referring MD: Marie Reena CROME, FNP   Chief Complaint  Patient presents with   Follow-up  Doing well  History of Present Illness:    Marie Hughes is a 69 y.o. female past medical history significant for coronary disease, in February she did have coronary CT angio done, which showed elevated calcium  score of 716, 96 percentile, moderate stenosis of proximal mid LAD however FFR was negative.  She was also noted to have some nodule in the lungs.,  Repeated CTs show stable nodules.  Additional problem include essential hypertension prediabetes obesity depression.  Left bundle branch block.  She was seen by me last time she was complaining having episode of near syncope.  Monitor has been placed however monitor did not show any significant arrhythmias so insignificant, asymptomatic supraventricular tachycardia.  Past Medical History:  Diagnosis Date   Acute cystitis without hematuria 07/30/2019   Last Assessment & Plan:  Formatting of this note might be different from the original. UA most consistent with uncomplicated UTI. No red flag features to suggest need for imaging, special testing or urgent specialty consultation at this time. Antibiotic started per visit orders. Will send UA for culture to confirm sensitivity to abx given. Recommend f/u with PCP or return to clinic or ED for worse   Arthralgia of knee, left 05/17/2019   Last Assessment & Plan:  Formatting of this note might be different from the original. Recently provided intra-articular injection is done very well, could repeat in the future if persistent symptoms.   Arthropathy, unspecified 11/01/2022   Last Assessment & Plan: Formatting of this note might be different from the original. Problem complexity: Chronic problem/illness, stable Assessment:  she  endorses chronic pain of joints in multiple sites. Plan: labs and will advise accordingly.     Bilateral carotid bruits 10/17/2019   Bilateral leg cramps 12/07/2021   Last Assessment & Plan: Formatting of this note might be different from the original. New to provider. Problem complexity: Chronic problem/illness, with progression Assessment:  Nocturnal BLE cramps for several months. This is likely multifactoral given her spinal disease and possible hypokalemia. Plan: labs today and will advise accordingly. Supportive care discussed.     Bladder problem    Cervical post-laminectomy syndrome 08/21/2017   Last Assessment & Plan:  Formatting of this note might be different from the original. Multilevel cervical fusion with continued neck and right upper extremity radicular pain.  Stable at this time without any progression, we will hold on any further imaging at this time.   Chest pain 12/21/2017   Last Assessment & Plan:  Intermittent. Non-radiating. No N/V/D. No vision changes.  Last Assessment & Plan:  Formatting of this note might be different from the original. No complaints of chest pain since hospital discharge.   Chronic foot pain, right 01/24/2020   Last Assessment & Plan:  Formatting of this note might be different from the original. 68 year old female with chronic pain of multiple etiologies, at this time most limiting complaint is right foot pain secondary to refractory plantar fasciitis status post surgical intervention.  She is followed by Podiatry.  Today I recommended obtaining a more appropriate supportive footwear, we have also discu   Chronic left shoulder pain 06/19/2020   Last Assessment & Plan:  Formatting of this note might be  different from the original. Recently referred to orthopedics by PCP, several weeks status post intra-articular injection with excellent benefit.  I did let patient know that I am happy to repeat that procedure in the future should she need it.   Chronic  left-sided thoracic back pain 07/14/2022   Chronic pain of right ankle 06/19/2020   Chronic right shoulder pain 11/12/2018   Last Assessment & Plan:  Formatting of this note might be different from the original. Today we will take plain films of the shoulder to further evaluate etiology of her pain.  Discussed with patient that it is very likely radicular in nature, as I do not have any findings on exam suspicious of true shoulder etiology.   Chronically on opiate therapy 12/20/2016   Last Assessment & Plan:  Risks of this type of medication including the risk of addiction and, if misused, of death are reviewed with the patient.  The patient feels the benefits of pain relief and increased ability to engage in self-care activities improve the quality of life to outweigh these risks.  Last Assessment & Plan:  Formatting of this note might be different from the original. Will cont   Class 2 drug-induced obesity with serious comorbidity and body mass index (BMI) of 39.0 to 39.9 in adult 12/21/2017   Last Assessment & Plan:  Formatting of this note might be different from the original. BMI Assessment: Current Body mass index is 39.91 kg/m.  Patient BMI currently is above average (>25 kg/m2); BMI follow up plan is completed . Current barriers to healthy weight management include none.  BMI Plan:  Today, [name] and I have discussed methods to help address her current weight.   General weight l   Class 2 severe obesity due to excess calories with serious comorbidity and body mass index (BMI) of 39.0 to 39.9 in adult 06/19/2020   Last Assessment & Plan:  Formatting of this note might be different from the original. BMI Assessment: Current Body mass index is 39.68 kg/m.  Patient BMI currently is above average (>25 kg/m2); BMI follow up plan is completed . Current barriers to healthy weight management include pain.  BMI Plan:  Today, [name] and I have discussed methods to help address her current weight.   General  weight l   Complete tear of rotator cuff 07/14/2022   Coronary artery disease involving native coronary artery of native heart with unstable angina pectoris (HCC) 09/12/2018   Last Assessment & Plan:  Formatting of this note might be different from the original. She is followed by cardiology and has an upcoming appointment with them.   Advised to continue current medications.   DDD (degenerative disc disease), lumbar 08/21/2017   Last Assessment & Plan:  Stble. No exacerbation of symptoms.  Last Assessment & Plan:  Formatting of this note might be different from the original. Chronic back pain and bilateral lower extremity radicular symptoms that appear to correspond to L4/5 distribution. Review of most recent lumbar MRI from 2013 shows multilevel degenerative changes most prominent at L4-5 where there is mild spinal steno   Degenerative cervical spinal stenosis 01/05/2016   Last Assessment & Plan:  Formatting of this note might be different from the original. We will continue current medications.  Patient understands she may benefit percutaneous interventions.  Patient has had previous cervical spine surgeries.   Depression    Diabetes mellitus due to underlying condition with unspecified complications (HCC) 04/12/2021   Dietary counseling 04/17/2020   Diverticulosis 07/14/2022  Dizziness 12/18/2020   Last Assessment & Plan:  Formatting of this note might be different from the original. Patient complains of dizziness and overall not feeling well.  She does complain of feelings of hot and cold as well as sweats.  She admits working at the [employer] is reunion in the direct heat.  She did this for several days.  She feels she may be dehydrated somewhat.  She has no focal neuro def   Dyspnea on exertion 04/12/2020   Dysuria 07/24/2019   Last Assessment & Plan:  Formatting of this note might be different from the original. Assessment  Pain intermittent in the suprapubic. Mild backpain.  Symptoms come and go, then come back. Hx of kidney stone.  Reports weak urine stream Afebrile, no chills This may be a recurrence of kidney stone UA +wbc 75, will culture  Plan  Will start Flomax and await the urine culture   Elevated blood pressure reading 07/29/2021   Last Assessment & Plan:   Formatting of this note might be different from the original.  Blood pressure elevated in clinic today.  Patient will continue to follow-up with his primary care provider for further management.     Encounter for screening mammogram for breast cancer 12/18/2019   Essential hypertension 01/05/2016   Last Assessment & Plan:  Formatting of this note might be different from the original. Blood pressure elevated today-follow-up with primary care for further management.   Exposure to 2019 novel coronavirus 07/01/2019   Last Assessment & Plan:  Formatting of this note might be different from the original. POCT Sars-Cov-2 Sophia NEGATIVE POCT Sars-COV-2 Aptima-COV  I have explained to the patient they must quarantine according to [facility] current guideline. They are encouraged to protect others in the household by wearing a mask, covering cough, and cleansing any area used by the patient, and if possible quarantine in    Heart disease 12/21/2017   Last Assessment & Plan:  No reports of chest pain in approximately 8 months. Will refill Nitro SL tabs.  Last Assessment & Plan:  Formatting of this note might be different from the original. Complaining of more frequent episodes of dyspnea on exertion, and bilateral leg swelling which are chronic concerns for her.  Advise she follow-up with her PCP in regards to these concerns for further evaluat   Hematuria of unknown etiology 07/14/2022   Last Assessment & Plan: Formatting of this note might be different from the original. Patient complaining of left flank pain that is chronic.  However, we obtained a urine to rule out an underlying cystitis or pyelonephritis.  Her urine did  show some hematuria.  We will send for microscopic analysis as well as culture.     History of COVID-19 04/12/2022   Last Assessment & Plan: Formatting of this note might be different from the original. Acute illness that is progressing. Diagnosis COVID 19 per rapid testing. Plan Discussed the importance of avoiding unnecessary antibiotic therapy. Suggested symptomatic OTC remedies. OTC antihistamine for rash. Discussed paxlovid. She defers at this time and requests to manage without pharmacotherapy. She has no    Hypertension    Hypertensive urgency 01/05/2016   Last Assessment & Plan:  Formatting of this note might be different from the original. Blood pressure is controlled today. 128/70. Advise to continue monitoring. Notify office for elevation. Continue current medications and f/u with cardiology.   IFG (impaired fasting glucose) 12/21/2017   Last Assessment & Plan:  Monitoring with blood  work. POC according to results.'  Last Assessment & Plan:  Formatting of this note is different from the original. Lab Results  Component Value Date   HGBA1C 6.2 12/21/2017   Prediabetic. Monitoring with BW  Monitoring with blood work  Change in diet including a whole plant based diet, limiting meats especially red meat encouraged.   Avoiding unhea   Impingement syndrome of left shoulder 07/14/2022   Lack of energy 11/01/2022   Last Assessment & Plan: Formatting of this note might be different from the original. Undiagnosed problem with uncertain prognosis. Assessment: complains of chronic fatigue. Plan: labs and will advise accordingly     Left bundle branch block (LBBB) 10/17/2019   Formatting of this note might be different from the original. Noted to have LBBB in old records. LBBB mentioned in cardiologist's office note 10/17/19.  Last Assessment & Plan:  Formatting of this note might be different from the original. This is chronic and was noted on recent stress test. She is following with cardiology. Advised to  proceed with next appointment.  She is asymptomatic today.   Left upper quadrant pain 08/03/2021   Last Assessment & Plan:   Formatting of this note might be different from the original.  Problem complexity: Chronic problem/illness, with progression     Assessment:  Left upper quadrant pain for several months. She is fearful may have pancreatic cancer due to significant family hx of similar presentation. She is scheduled for MRI of the thoracic spine this week to evaluate for issues of the spin   Localized swelling of both lower legs 04/13/2021   Last Assessment & Plan:   Formatting of this note might be different from the original.  Patient complains of an approximate 2 month onset of bilateral lower extremity swelling.  However, upon exam I did not note any changes from previous exam.  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  There was no pitting.  Weight is stable without significant weight gain.  Her lungs were clear to ausc   Long term (current) use of opiate analgesic 12/20/2016   Last Assessment & Plan: Formatting of this note might be different from the original. Patient and I have discussed the hazardous effects of continued opiate pain medication usage. Risks and benefits of above medications including but not limited to possibility of hyperalgesia, respiratory depression, sedation, and even death were discussed with the patient who expressed an understanding. Patient i   Long term use of drug 12/18/2019   Lumbar radiculitis 01/05/2016   Last Assessment & Plan:  Formatting of this note might be different from the original. Patient will continue with current medications.  Patient understands she may benefit percutaneous interventions.   Mass of right foot 07/14/2022   Mixed hyperlipidemia 01/05/2016   Last Assessment & Plan:  Formatting of this note is different from the original. Problem Complexity:  Chronic problem/illness, stable  Pertinent Data: LDL Calculated (mg/dL)  Date Value   88/98/7978 48   Cholesterol (mg/dl)  Date Value  88/98/7978 135   HDL (mg/dl)  Date Value  88/98/7978 34.4 (L)   Triglycerides (mg/dl)  Date Value  88/98/7978 261 (H)   AST (U/L)  Date Value  12/18/2019 20   ALT (   Morbid obesity (HCC) 04/16/2018   Muscle pain    Muscle spasm of back 08/21/2017   Last Assessment & Plan:  Formatting of this note might be different from the original. Continue p.r.n. Soma   MVP (mitral valve prolapse)  07/14/2022   Nausea 12/18/2020   Nephrolithiasis 07/14/2022   Nocturia 07/14/2022   Obesity due to excess calories 01/05/2016   Last Assessment & Plan:  Formatting of this note might be different from the original. Diet was discussed and reviewed with the patient. Modify diet to eliminate bad fats (fast food, junk foods) and increase fiber intake. Regular exercise several times per week as appropriate, along with proper diet will help to improve overall health. Discussion concerning the health risk of obesity educated. Edu   Otalgia, left ear 12/18/2019   Last Assessment & Plan:  Formatting of this note might be different from the original. Physical examination revealed a normal exam.  No evidence of infection.  No evidence of cerumen impaction.   Other chronic pain 08/21/2017   Last Assessment & Plan:  BRELEIGH CARPINO is a 68 y.o.  year old female with Low back pain, Neck pain, due to Multilevel multifactorial degenerative changes in the lumbar and cervical spine, resulting in lower extremity radiculopathy bilaterally, in the setting of previous surgery in the cervical spine. Today I will refill the patient's current medications of Hydrocodone 10/325 mg PO PRN qid, and S   Pain and swelling of lower extremity, right 11/12/2018   Last Assessment & Plan:  Formatting of this note might be different from the original. Due to patient's recent onset of worsening lower extremity pain, with increased unilateral swelling and diffuse calf tenderness of the right extremity, we  recommend she go for a right lower extremity ultrasound to rule out a DVT.   Pain medication agreement 08/10/2018   Last Assessment & Plan:  There is a current opioid agreement on file within the clinic and urine drug screens will be collected as needed to be in compliance with clinic policies.  Last Assessment & Plan:  Formatting of this note might be different from the original. Review of prior UDS results are consistent with regimen.  [place]  controlled substance registry reviewed and is consistent wi   Plantar fasciitis, right 07/14/2022   Postmenopausal state 10/11/2021   Pre-syncope 04/12/2020   Last Assessment & Plan:  Formatting of this note might be different from the original. No further episodes since hospital discharge.   Recurrent cystitis 12/18/2019   Recurrent major depressive disorder, in partial remission 01/05/2016   Last Assessment & Plan:  Formatting of this note might be different from the original. This is a chronic problem and is stable.  She denies suicidal or homicidal ideations.  Advised to continue current medications.   Recurrent UTI 07/14/2022   Suspected 2019 novel coronavirus infection 12/18/2020   Swelling    Tendinitis of left rotator cuff 07/14/2022   Thoracic radiculopathy 02/11/2019   Last Assessment & Plan:  Formatting of this note might be different from the original. Plain films of the thoracic spine completed after last appointment show ACDF with mid lower cervical spine with thoracic vertebral body alignment and disc spaces normal.  Responded well to previous oral steroids.  Have discussed possibility of C7-T1 CESI should her symptoms return.   Type 2 diabetes mellitus without complication, without long-term current use of insulin (HCC) 12/17/2020   Last Assessment & Plan:  Formatting of this note might be different from the original. Encourage her today to focus on losing weight, decreasing carbohydrate intake.  Hopefully she can make some  improvements here to get this better controlled.   Uncomplicated opioid dependence (HCC) 01/05/2016   Viral syndrome 12/18/2020   Vitamin D deficiency  12/18/2019    Past Surgical History:  Procedure Laterality Date   ABDOMINAL HYSTERECTOMY     FOOT SURGERY     HAND SURGERY     BOTH HANDS   LEFT HEART CATH AND CORONARY ANGIOGRAPHY N/A 08/20/2018   Procedure: LEFT HEART CATH AND CORONARY ANGIOGRAPHY;  Surgeon: Dann Candyce RAMAN, MD;  Location: [facility] INVASIVE CV LAB;  Service: Cardiovascular;  Laterality: N/A;   NECK SURGERIES      Current Medications: Active Medications[1]   Allergies:   Nitrofurantoin   Social History   Socioeconomic History   Marital status: Widowed    Spouse name: Not on file   Number of children: Not on file   Years of education: Not on file   Highest education level: Not on file  Occupational History   Not on file  Tobacco Use   Smoking status: Former    Current packs/day: 0.00    Types: Cigarettes    Quit date: 2008    Years since quitting: 17.9   Smokeless tobacco: Never  Substance and Sexual Activity   Alcohol  use: No    Alcohol /week: 0.0 standard drinks of alcohol    Drug use: No   Sexual activity: Not on file  Other Topics Concern   Not on file  Social History Narrative   Not on file   Social Drivers of Health   Tobacco Use: Medium Risk (05/28/2024)   Patient History    Smoking Tobacco Use: Former    Smokeless Tobacco Use: Never    Passive Exposure: Not on Actuary Strain: Not on file  Food Insecurity: Not on file  Transportation Needs: Not on file  Physical Activity: Not on file  Stress: Not on file  Social Connections: Not on file  Depression (EYV7-0): Not on file  Alcohol  Screen: Not on file  Housing: Not on file  Utilities: Not on file  Health Literacy: Not on file     Family History: The patient's family history includes Coronary artery disease in her father; Heart attack in her mother and paternal  grandmother; Other in her brother and father; Pneumonia in her mother. ROS:   Please see the history of present illness.    All 14 point review of systems negative except as described per history of present illness  EKGs/Labs/Other Studies Reviewed:         Recent Labs: 08/22/2023: ALT 21; BUN 16; Creatinine, Ser 0.80; Potassium 4.2; Sodium 142  Recent Lipid Panel    Component Value Date/Time   CHOL 148 08/22/2023 0922   TRIG 188 (H) 08/22/2023 0922   HDL 46 08/22/2023 0922   CHOLHDL 3.2 08/22/2023 0922   LDLCALC 71 08/22/2023 0922   LDLDIRECT 88 12/23/2022 1122    Physical Exam:    VS:  BP (!) 140/90   Pulse 61   Ht 5' 1 (1.549 m)   Wt 206 lb (93.4 kg)   SpO2 98%   BMI 38.92 kg/m     Wt Readings from Last 3 Encounters:  05/28/24 206 lb (93.4 kg)  02/26/24 210 lb (95.3 kg)  08/22/23 216 lb (98 kg)     GEN:  Well nourished, well developed in no acute distress HEENT: Normal NECK: No JVD; No carotid bruits LYMPHATICS: No lymphadenopathy CARDIAC: RRR, no murmurs, no rubs, no gallops RESPIRATORY:  Clear to auscultation without rales, wheezing or rhonchi  ABDOMEN: Soft, non-tender, non-distended MUSCULOSKELETAL:  No edema; No deformity  SKIN: Warm and dry LOWER EXTREMITIES: no swelling NEUROLOGIC:  Alert and oriented x 3 PSYCHIATRIC:  Normal affect   ASSESSMENT:    1. Coronary artery disease involving native coronary artery of native heart with unstable angina pectoris (HCC)   2. Left bundle branch block (LBBB)   3. Pre-syncope   4. Type 2 diabetes mellitus without complication, without long-term current use of insulin (HCC)    PLAN:    In order of problems listed above:  Coronary disease stable from that point review no signs and symptoms of problem.  She is on antiplatelet therapy which I will continue. Dyslipidemia I did review [facility] she is taking Crestor  10 which is moderate intensity statin, LDL 71 HDL is 46 and will repeat fasting lipid profile next time  I see her. Left bundle branch block chronic, continue monitoring. Presyncope denies having any still we have no good explanation for his symptomatology in terms of presyncope.  I suspect maybe dehydration or orthostasis played some role.  She said she only had those episodes during the summertime.  I asked her to stay well-hydrated   Medication Adjustments/Labs and Tests Ordered: Current medicines are reviewed at length with the patient today.  Concerns regarding medicines are outlined above.  No orders of the defined types were placed in this encounter.  Medication changes: No orders of the defined types were placed in this encounter.   Signed, Lamar DOROTHA Fitch, MD, [facility] 05/28/2024 1:14 PM    [facility]    [1]  Current Meds  Medication Sig   amitriptyline (ELAVIL) 10 MG tablet Take 10 mg by mouth at bedtime.   amLODipine (NORVASC) 10 MG tablet Take 10 mg by mouth every evening.    aspirin  EC 81 MG tablet Take 81 mg by mouth daily.   carvedilol  (COREG ) 25 MG tablet Take 1 tablet (25 mg total) by mouth 2 (two) times daily with a meal.   cholecalciferol (VITAMIN D3) 25 MCG (1000 UNIT) tablet Take 1,000 Units by mouth daily.   Coenzyme Q10 100 MG capsule Take 100 mg by mouth daily.   cyclobenzaprine (FLEXERIL) 5 MG tablet Take 5 mg by mouth 3 (three) times daily as needed for muscle spasms.   ezetimibe  (ZETIA ) 10 MG tablet Take 1 tablet (10 mg total) by mouth daily.   HYDROcodone-acetaminophen  (NORCO) 10-325 MG per tablet Take 1 tablet by mouth every 6 (six) hours as needed for moderate pain.   isosorbide  mononitrate (IMDUR ) 30 MG 24 hr tablet Take 1 tablet (30 mg total) by mouth daily.   KLOR-CON M20 20 MEQ tablet Take 20 mEq by mouth daily at 3 pm.    lisinopril-hydrochlorothiazide (PRINZIDE,ZESTORETIC) 20-12.5 MG per tablet Take 1 tablet by mouth daily.   Methylcobalamin (B-12) 5000 MCG TBDP Take 5,000 mcg by mouth daily.   Multiple Vitamins-Minerals  (ADULT GUMMY PO) Take 1 tablet by mouth 2 (two) times a week.   nitroGLYCERIN  (NITROSTAT ) 0.4 MG SL tablet Place 1 tablet (0.4 mg total) under the tongue every 5 (five) minutes as needed for chest pain.   rosuvastatin  (CRESTOR ) 10 MG tablet Take 10 mg by mouth daily.

## 2024-05-28 NOTE — Patient Instructions (Signed)
